# Patient Record
Sex: Female | Born: 1990 | Race: Black or African American | Hispanic: No | Marital: Single | Smoking: Current every day smoker
Health system: Southern US, Community
[De-identification: ages and names within clinical notes are randomized; demographics above are authoritative.]

## PROBLEM LIST (undated history)

## (undated) ENCOUNTER — Ambulatory Visit (HOSPITAL_COMMUNITY): Admission: EM | Disposition: A | Discharge status: Home / Self Care | Payer: Medicaid Other

## (undated) ENCOUNTER — Emergency Department (HOSPITAL_COMMUNITY): Admission: EM | Payer: Medicaid Other | Source: Home / Self Care

## (undated) DIAGNOSIS — K4090 Unilateral inguinal hernia, without obstruction or gangrene, not specified as recurrent: Secondary | ICD-10-CM

## (undated) DIAGNOSIS — I10 Essential (primary) hypertension: Secondary | ICD-10-CM

## (undated) HISTORY — PX: HERNIA REPAIR: SHX51

---

## 2015-01-02 ENCOUNTER — Emergency Department (HOSPITAL_BASED_OUTPATIENT_CLINIC_OR_DEPARTMENT_OTHER): Payer: Self-pay

## 2015-01-02 ENCOUNTER — Encounter (HOSPITAL_BASED_OUTPATIENT_CLINIC_OR_DEPARTMENT_OTHER): Payer: Self-pay | Admitting: Emergency Medicine

## 2015-01-02 ENCOUNTER — Emergency Department (HOSPITAL_BASED_OUTPATIENT_CLINIC_OR_DEPARTMENT_OTHER)
Admission: EM | Admit: 2015-01-02 | Discharge: 2015-01-02 | Disposition: A | Discharge status: Home / Self Care | Payer: Self-pay | Attending: Emergency Medicine | Admitting: Emergency Medicine

## 2015-01-02 DIAGNOSIS — Z720 Tobacco use: Secondary | ICD-10-CM | POA: Insufficient documentation

## 2015-01-02 DIAGNOSIS — R319 Hematuria, unspecified: Secondary | ICD-10-CM

## 2015-01-02 DIAGNOSIS — Z8719 Personal history of other diseases of the digestive system: Secondary | ICD-10-CM | POA: Insufficient documentation

## 2015-01-02 DIAGNOSIS — R1084 Generalized abdominal pain: Secondary | ICD-10-CM

## 2015-01-02 DIAGNOSIS — N390 Urinary tract infection, site not specified: Secondary | ICD-10-CM | POA: Insufficient documentation

## 2015-01-02 DIAGNOSIS — R102 Pelvic and perineal pain: Secondary | ICD-10-CM

## 2015-01-02 DIAGNOSIS — Z3202 Encounter for pregnancy test, result negative: Secondary | ICD-10-CM | POA: Insufficient documentation

## 2015-01-02 DIAGNOSIS — N898 Other specified noninflammatory disorders of vagina: Secondary | ICD-10-CM | POA: Insufficient documentation

## 2015-01-02 HISTORY — DX: Unilateral inguinal hernia, without obstruction or gangrene, not specified as recurrent: K40.90

## 2015-01-02 LAB — URINALYSIS, ROUTINE W REFLEX MICROSCOPIC
BILIRUBIN URINE: NEGATIVE
Glucose, UA: NEGATIVE mg/dL
Ketones, ur: 40 mg/dL — AB
Nitrite: NEGATIVE
PH: 6 (ref 5.0–8.0)
Protein, ur: NEGATIVE mg/dL
SPECIFIC GRAVITY, URINE: 1.017 (ref 1.005–1.030)
UROBILINOGEN UA: 0.2 mg/dL (ref 0.0–1.0)

## 2015-01-02 LAB — CBC WITH DIFFERENTIAL/PLATELET
BASOS ABS: 0 10*3/uL (ref 0.0–0.1)
BASOS PCT: 0 % (ref 0–1)
EOS PCT: 0 % (ref 0–5)
Eosinophils Absolute: 0 10*3/uL (ref 0.0–0.7)
HEMATOCRIT: 35.3 % — AB (ref 36.0–46.0)
Hemoglobin: 11.7 g/dL — ABNORMAL LOW (ref 12.0–15.0)
LYMPHS PCT: 4 % — AB (ref 12–46)
Lymphs Abs: 0.9 10*3/uL (ref 0.7–4.0)
MCH: 27.5 pg (ref 26.0–34.0)
MCHC: 33.1 g/dL (ref 30.0–36.0)
MCV: 83.1 fL (ref 78.0–100.0)
MONO ABS: 0.8 10*3/uL (ref 0.1–1.0)
Monocytes Relative: 4 % (ref 3–12)
NEUTROS ABS: 20.1 10*3/uL — AB (ref 1.7–7.7)
Neutrophils Relative %: 92 % — ABNORMAL HIGH (ref 43–77)
PLATELETS: 174 10*3/uL (ref 150–400)
RBC: 4.25 MIL/uL (ref 3.87–5.11)
RDW: 13 % (ref 11.5–15.5)
WBC: 21.9 10*3/uL — AB (ref 4.0–10.5)

## 2015-01-02 LAB — COMPREHENSIVE METABOLIC PANEL
ALBUMIN: 3.7 g/dL (ref 3.5–5.0)
ALT: 8 U/L — AB (ref 14–54)
AST: 13 U/L — AB (ref 15–41)
Alkaline Phosphatase: 51 U/L (ref 38–126)
Anion gap: 7 (ref 5–15)
BUN: 7 mg/dL (ref 6–20)
CHLORIDE: 102 mmol/L (ref 101–111)
CO2: 28 mmol/L (ref 22–32)
CREATININE: 0.87 mg/dL (ref 0.44–1.00)
Calcium: 8.4 mg/dL — ABNORMAL LOW (ref 8.9–10.3)
GFR calc Af Amer: >60 mL/min (ref >60)
GFR calc non Af Amer: >60 mL/min (ref >60)
GLUCOSE: 103 mg/dL — AB (ref 65–99)
POTASSIUM: 3 mmol/L — AB (ref 3.5–5.1)
Sodium: 137 mmol/L (ref 135–145)
Total Bilirubin: 1 mg/dL (ref 0.3–1.2)
Total Protein: 7.1 g/dL (ref 6.5–8.1)

## 2015-01-02 LAB — WET PREP, GENITAL
TRICH WET PREP: NONE SEEN
YEAST WET PREP: NONE SEEN

## 2015-01-02 LAB — URINE MICROSCOPIC-ADD ON

## 2015-01-02 LAB — LIPASE, BLOOD: Lipase: 14 U/L — ABNORMAL LOW (ref 22–51)

## 2015-01-02 LAB — PREGNANCY, URINE: Preg Test, Ur: NEGATIVE

## 2015-01-02 MED ORDER — IOHEXOL 300 MG/ML  SOLN
100.0000 mL | Freq: Once | INTRAMUSCULAR | Status: AC | PRN
  Administered 2015-01-02: 100 mL via INTRAVENOUS

## 2015-01-02 MED ORDER — ONDANSETRON HCL 4 MG PO TABS: 4.0000 mg | ORAL_TABLET | Freq: Three times a day (TID) | ORAL | Status: DC | PRN

## 2015-01-02 MED ORDER — SODIUM CHLORIDE 0.9 % IV BOLUS (SEPSIS)
1000.0000 mL | Freq: Once | INTRAVENOUS | Status: AC
  Administered 2015-01-02: 1000 mL via INTRAVENOUS

## 2015-01-02 MED ORDER — CEFTRIAXONE SODIUM 250 MG IJ SOLR
INTRAMUSCULAR | Status: AC
  Filled 2015-01-02: qty 250

## 2015-01-02 MED ORDER — ONDANSETRON HCL 4 MG/2ML IJ SOLN
4.0000 mg | Freq: Once | INTRAMUSCULAR | Status: AC
  Administered 2015-01-02: 4 mg via INTRAVENOUS
  Filled 2015-01-02: qty 2

## 2015-01-02 MED ORDER — HYDROCODONE-ACETAMINOPHEN 5-325 MG PO TABS: 1.0000 | ORAL_TABLET | Freq: Four times a day (QID) | ORAL | Status: DC | PRN

## 2015-01-02 MED ORDER — IOHEXOL 300 MG/ML  SOLN
25.0000 mL | Freq: Once | INTRAMUSCULAR | Status: AC | PRN
  Administered 2015-01-02: 25 mL via ORAL

## 2015-01-02 MED ORDER — CEPHALEXIN 500 MG PO CAPS: 500.0000 mg | ORAL_CAPSULE | Freq: Two times a day (BID) | ORAL | Status: DC

## 2015-01-02 MED ORDER — MORPHINE SULFATE (PF) 4 MG/ML IV SOLN
4.0000 mg | Freq: Once | INTRAVENOUS | Status: AC
  Administered 2015-01-02: 4 mg via INTRAVENOUS
  Filled 2015-01-02: qty 1

## 2015-01-02 MED ORDER — DEXTROSE 5 % IV SOLN
250.0000 mg | Freq: Once | INTRAVENOUS | Status: AC
  Administered 2015-01-02: 250 mg via INTRAVENOUS
  Filled 2015-01-02: qty 250

## 2015-01-02 MED ORDER — GI COCKTAIL ~~LOC~~
30.0000 mL | Freq: Once | ORAL | Status: AC
  Administered 2015-01-02: 30 mL via ORAL
  Filled 2015-01-02: qty 30

## 2015-01-02 MED ORDER — AZITHROMYCIN 250 MG PO TABS
1000.0000 mg | ORAL_TABLET | Freq: Once | ORAL | Status: AC
  Administered 2015-01-02: 1000 mg via ORAL
  Filled 2015-01-02: qty 4

## 2015-01-02 NOTE — Discharge Instructions (Signed)

## 2015-01-02 NOTE — ED Notes (Signed)
Patient went to fair Sunday night and rode the fair rides. The patient reports that when she got home she started to have pain in her stomach. The patient reports that early this am the pain is so bad that she is unable to walk around due to the pain in her stomach

## 2015-01-02 NOTE — ED Provider Notes (Signed)
CSN: [id number]     Arrival date & time 01/02/15  4098 History   First MD Initiated Contact with Patient 01/02/15 [phone]     Chief Complaint  Patient presents with  . Abdominal Pain     (Consider location/radiation/quality/duration/timing/severity/associated sxs/prior Treatment) Patient is a 24 y.o. female presenting with abdominal pain.  Abdominal Pain Pain location:  Epigastric Pain quality comment:  Tearing Pain radiation: around to both flanks. Pain severity:  Severe Onset quality:  Gradual Duration:  2 days Timing:  Constant Progression:  Worsening Chronicity:  New Context comment:  Began after a trip to the fair.  she started feeling bad after going on rides.  she ate funnel cake, a juice smoothie, and roasted corn. Relieved by:  Nothing Exacerbated by: standing, having bowel movements or urinating. Ineffective treatments:  NSAIDs Associated symptoms: dysuria, nausea and vomiting (x 1. yesterday)   Associated symptoms: no constipation, no diarrhea and no hematuria     Past Medical History  Diagnosis Date  . Hernia, inguinal    Past Surgical History  Procedure Laterality Date  . Hernia repair     History reviewed. No pertinent family history. Social History  Substance Use Topics  . Smoking status: Current Some Day Smoker  . Smokeless tobacco: None  . Alcohol Use: Yes     Comment: occ   OB History    No data available     Review of Systems  Gastrointestinal: Positive for nausea, vomiting (x 1. yesterday) and abdominal pain. Negative for diarrhea and constipation.  Genitourinary: Positive for dysuria. Negative for hematuria.  All other systems reviewed and are negative.     Allergies  Sulfa antibiotics  Home Medications   Prior to Admission medications   Medication Sig Start Date End Date Taking? Authorizing Provider  ibuprofen (ADVIL,MOTRIN) 200 MG tablet Take 200 mg by mouth every 6 (six) hours as needed.   Yes Historical Provider, MD   BP 130/73  mmHg  Pulse 104  Temp(Src) 99.9 F (37.7 C) (Oral)  Resp 20  Ht 5\' 9"  (1.753 m)  Wt 170 lb (77.111 kg)  BMI 25.09 kg/m2  SpO2 98%  LMP 01/02/2015 Physical Exam  Constitutional: She is oriented to person, place, and time. She appears well-developed and well-nourished. No distress.  HENT:  Head: Normocephalic and atraumatic.  Mouth/Throat: Oropharynx is clear and moist.  Eyes: Conjunctivae are normal. Pupils are equal, round, and reactive to light. No scleral icterus.  Neck: Neck supple.  Cardiovascular: Normal rate, regular rhythm, normal heart sounds and intact distal pulses.   No murmur heard. Pulmonary/Chest: Effort normal and breath sounds normal. No stridor. No respiratory distress. She has no rales.  Abdominal: Soft. Bowel sounds are normal. She exhibits no distension. There is no CVA tenderness.  Diffuse abdominal pain, worst in epigastrium.  No rigidity, rebound, or guarding.  Genitourinary: There is no tenderness or lesion on the right labia. There is no tenderness or lesion on the left labia. Uterus is tender. Cervix exhibits motion tenderness, discharge and friability. Right adnexum displays tenderness. Right adnexum displays no mass. Left adnexum displays tenderness. Left adnexum displays no mass. No signs of injury around the vagina. Vaginal discharge (yellow, copious) found.  Musculoskeletal: Normal range of motion.  Neurological: She is alert and oriented to person, place, and time.  Skin: Skin is warm and dry. No rash noted.  Psychiatric: She has a normal mood and affect. Her behavior is normal.  Nursing note and vitals reviewed.   ED Course  Procedures (including critical care time) Labs Review Labs Reviewed  WET PREP, GENITAL - Abnormal; Notable for the following:    Clue Cells Wet Prep HPF POC TOO NUMEROUS TO COUNT (*)    WBC, Wet Prep HPF POC MANY (*)    All other components within normal limits  URINALYSIS, ROUTINE W REFLEX MICROSCOPIC (NOT AT [facility]) -  Abnormal; Notable for the following:    Color, Urine AMBER (*)    APPearance CLOUDY (*)    Hgb urine dipstick MODERATE (*)    Ketones, ur 40 (*)    Leukocytes, UA MODERATE (*)    All other components within normal limits  URINE MICROSCOPIC-ADD ON - Abnormal; Notable for the following:    Squamous Epithelial / LPF FEW (*)    Bacteria, UA MANY (*)    All other components within normal limits  CBC WITH DIFFERENTIAL/PLATELET - Abnormal; Notable for the following:    WBC 21.9 (*)    Hemoglobin 11.7 (*)    HCT 35.3 (*)    Neutrophils Relative % 92 (*)    Neutro Abs 20.1 (*)    Lymphocytes Relative 4 (*)    All other components within normal limits  COMPREHENSIVE METABOLIC PANEL - Abnormal; Notable for the following:    Potassium 3.0 (*)    Glucose, Bld 103 (*)    Calcium 8.4 (*)    AST 13 (*)    ALT 8 (*)    All other components within normal limits  LIPASE, BLOOD - Abnormal; Notable for the following:    Lipase 14 (*)    All other components within normal limits  PREGNANCY, URINE  HIV ANTIBODY (ROUTINE TESTING)  RPR  GC/CHLAMYDIA PROBE AMP ([place]) NOT AT [facility]    Imaging Review US Transvaginal Non-ob  01/02/2015   CLINICAL DATA:  Pelvic pain for 3 days.  EXAM: TRANSABDOMINAL AND TRANSVAGINAL ULTRASOUND OF PELVIS  DOPPLER ULTRASOUND OF OVARIES  TECHNIQUE: Both transabdominal and transvaginal ultrasound examinations of the pelvis were performed. Transabdominal technique was performed for global imaging of the pelvis including uterus, ovaries, adnexal regions, and pelvic cul-de-sac.  It was necessary to proceed with endovaginal exam following the transabdominal exam to visualize the ovaries. Color and duplex Doppler ultrasound was utilized to evaluate blood flow to the ovaries.  COMPARISON:  None.  FINDINGS: Uterus  Measurements: 9.6 x 3.3 x 4.5 cm. There are 4 slight hypoechoic lesions in the uterus, largest measures 1.5 x 1.7 x 1.3 cm in the anterior myometrium. These are probably  fibroadenomas. 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  Endometrium  Thickness: 4 mm. No focal abnormality visualized. There is fluid in the endometrial canal. There is a question 3 mm isoechoic focus in the endometrial canal, questioned polyp.  Right ovary  Measurements: 4.4 x 2.1 x 2.5 cm. Normal appearance/no adnexal mass.  Left ovary  Measurements: 4.4 x 3.1 x 3.3 cm. Normal appearance/no adnexal mass.  Pulsed Doppler evaluation of both ovaries demonstrates normal low-resistance arterial and venous waveforms.  Other findings  No free fluid. There is a 1.8 x 1.5 x 1.4 cm cyst adjacent to the left ovary.  IMPRESSION: Uterine fibroids.  Fluid in endometrial canal. Question polyp in the endometrial canal.   Electronically Signed   By: Sherian Rein M.D.   On: 01/02/2015 13:05   US Pelvis Complete  01/02/2015   CLINICAL DATA:  Pelvic pain for 3 days.  EXAM: TRANSABDOMINAL AND TRANSVAGINAL ULTRASOUND OF PELVIS  DOPPLER ULTRASOUND OF OVARIES  TECHNIQUE:  Both transabdominal and transvaginal ultrasound examinations of the pelvis were performed. Transabdominal technique was performed for global imaging of the pelvis including uterus, ovaries, adnexal regions, and pelvic cul-de-sac.  It was necessary to proceed with endovaginal exam following the transabdominal exam to visualize the ovaries. Color and duplex Doppler ultrasound was utilized to evaluate blood flow to the ovaries.  COMPARISON:  None.  FINDINGS: Uterus  Measurements: 9.6 x 3.3 x 4.5 cm. There are 4 slight hypoechoic lesions in the uterus, largest measures 1.5 x 1.7 x 1.3 cm in the anterior myometrium. 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  Endometrium  Thickness: 4 mm. No focal abnormality visualized. There is fluid in the endometrial canal. There is a question 3 mm isoechoic focus in the endometrial canal, questioned polyp.  Right ovary  Measurements: 4.4 x 2.1 x 2.5 cm. Normal appearance/no adnexal mass.  Left ovary   Measurements: 4.4 x 3.1 x 3.3 cm. Normal appearance/no adnexal mass.  Pulsed Doppler evaluation of both ovaries demonstrates normal low-resistance arterial and venous waveforms.  Other findings  No free fluid. There is a 1.8 x 1.5 x 1.4 cm cyst adjacent to the left ovary.  IMPRESSION: Uterine fibroids.  Fluid in endometrial canal. Question polyp in the endometrial canal.   Electronically Signed   By: Sherian Rein M.D.   On: 01/02/2015 13:05   Ct Abdomen Pelvis W Contrast  01/02/2015   CLINICAL DATA:  Mid abdominal pain since Sunday after riding on fair rides  EXAM: CT ABDOMEN AND PELVIS WITH CONTRAST  TECHNIQUE: Multidetector CT imaging of the abdomen and pelvis was performed using the standard protocol following bolus administration of intravenous contrast. Sagittal and coronal MPR images reconstructed from axial data set.  CONTRAST:  OMNIPAQUE IOHEXOL 300 MG/ML SOLN IV, 25mL OMNIPAQUE IOHEXOL 300 MG/ML SOLN PO  COMPARISON:  None  FINDINGS: Lung bases clear.  Indeterminate 5 mm intermediate attenuation lesion superiorly within liver image 10.  Liver, gallbladder, spleen, pancreas, kidneys, and adrenal glands otherwise normal.  Suboptimal opacification the GI tract, with small amounts of contrast identified only within the stomach and proximal small bowel.  No gross gastric or bowel abnormality identified within these limitations.  Appendix not localized.  Question 14 mm leiomyoma within uterus.  Otherwise normal appearing uterus, adnexae, bladder, and ureters.  Scattered pelvic phleboliths.  No mass, adenopathy, free fluid, free air or hernia.  Osseous structures unremarkable.  IMPRESSION: Questionable tiny leiomyoma at uterine fundus.  5 mm indeterminate liver lesion.  Otherwise negative exam.   Electronically Signed   By: Ulyses Southward M.D.   On: 01/02/2015 09:16   [place] Art/ven Flow Abd Pelv Doppler  01/02/2015   CLINICAL DATA:  Pelvic pain for 3 days.  EXAM: TRANSABDOMINAL AND TRANSVAGINAL ULTRASOUND OF  PELVIS  DOPPLER ULTRASOUND OF OVARIES  TECHNIQUE: Both transabdominal and transvaginal ultrasound examinations of the pelvis were performed. Transabdominal technique was performed for global imaging of the pelvis including uterus, ovaries, adnexal regions, and pelvic cul-de-sac.  It was necessary to proceed with endovaginal exam following the transabdominal exam to visualize the ovaries. Color and duplex Doppler ultrasound was utilized to evaluate blood flow to the ovaries.  COMPARISON:  None.  FINDINGS: Uterus  Measurements: 9.6 x 3.3 x 4.5 cm. There are 4 slight hypoechoic lesions in the uterus, largest measures 1.5 x 1.7 x 1.3 cm in the anterior myometrium. 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  Endometrium  Thickness: 4 mm. No focal abnormality  visualized. There is fluid in the endometrial canal. There is a question 3 mm isoechoic focus in the endometrial canal, questioned polyp.  Right ovary  Measurements: 4.4 x 2.1 x 2.5 cm. Normal appearance/no adnexal mass.  Left ovary  Measurements: 4.4 x 3.1 x 3.3 cm. Normal appearance/no adnexal mass.  Pulsed Doppler evaluation of both ovaries demonstrates normal low-resistance arterial and venous waveforms.  Other findings  No free fluid. There is a 1.8 x 1.5 x 1.4 cm cyst adjacent to the left ovary.  IMPRESSION: Uterine fibroids.  Fluid in endometrial canal. Question polyp in the endometrial canal.   Electronically Signed   By: Sherian Rein M.D.   On: 01/02/2015 13:05   I have personally reviewed and evaluated these images and lab results as part of my medical decision-making.   EKG Interpretation None      MDM   Final diagnoses:  Pelvic pain in female  Generalized abdominal pain  Urinary tract infection with hematuria, site unspecified    24 yo female with generalized abdominal pain.  Initially, pain appeared more epigastric.  However, after pain medication, pain localized more to LLQ.  CT scan did not reveal etiology.   Pelvic exam then revealed copious discharge and cervical friability.  She endorsed multiple sexual partners and inconsistent condom use.  Treated empirically with ceftriaxone and azithromycin.  [place] did not show evidence of TOA.  DC'd home, but given strict return precautions.  Has mild evidence of UTI, but with pain and leukocytosis, I think this should be treated with abx.      Blake Divine, MD 01/02/15 [phone]

## 2015-01-03 LAB — HIV ANTIBODY (ROUTINE TESTING W REFLEX): HIV SCREEN 4TH GENERATION: NONREACTIVE

## 2015-01-03 LAB — GC/CHLAMYDIA PROBE AMP (~~LOC~~) NOT AT ARMC
Chlamydia: NEGATIVE
Neisseria Gonorrhea: POSITIVE — AB

## 2015-01-03 LAB — RPR: RPR Ser Ql: NONREACTIVE

## 2015-01-04 ENCOUNTER — Telehealth (HOSPITAL_BASED_OUTPATIENT_CLINIC_OR_DEPARTMENT_OTHER): Payer: Self-pay | Admitting: Emergency Medicine

## 2015-01-04 LAB — URINE CULTURE

## 2015-11-22 ENCOUNTER — Encounter: Payer: Self-pay | Admitting: *Deleted

## 2015-12-05 ENCOUNTER — Encounter: Payer: Self-pay | Admitting: Medical

## 2016-04-21 NOTE — L&D Delivery Note (Cosign Needed)
Patient is a 26 y.o. now G3P10021s/p NSVD at 7270w6d, who was admitted for IOL for gHTN.  Delivery Note At 3:09 AM a viable female was delivered via Vaginal, Spontaneous Delivery (Presentation: Cephalic ; LOA).  APGAR: 8, ; weight pending   Placenta status:intact Cord: 3 vessel  with the following complications: none .   Anesthesia: epidural  Episiotomy:  none Lacerations: vaginal laceration  Suture Repair: 2.0 vicryl Est. Blood Loss (mL):  200  Mom to postpartum.  Baby to Couplet care / Skin to Skin.  Head delivered LOA. No nuchal cord present. Shoulder and body delivered in usual fashion. Infant with spontaneous cry, placed on mother's abdomen, dried and bulb suctioned. Cord clamped x 2 after 1-minute delay, and cut by family member. Cord blood drawn. Placenta delivered spontaneously with gentle cord traction. Fundus firm with massage and Pitocin. Perineum inspected and found to have vaginal laceration which was repaired with 2.0 vicryl with good hemostasis achieved.  Teodoro KilKerrriann S. Minott,  MD Family Medicine Resident PGY-1 02/13/17, 3:32 AM  The above was performed by Dr. Verta EllenMinott under my direct supervision and guidance.    Please schedule this patient for PP visit in: 1 week High risk pregnancy complicated by: HTN Delivery mode:  SVD Anticipated Birth Control:  POPs PP Procedures needed: BP check  Schedule Integrated BH visit: no Provider: Any provider

## 2016-07-20 ENCOUNTER — Inpatient Hospital Stay (HOSPITAL_COMMUNITY)
Admission: AD | Admit: 2016-07-20 | Discharge: 2016-07-20 | Disposition: A | Discharge status: Home / Self Care | Payer: Medicaid Other | Source: Ambulatory Visit | Attending: Obstetrics and Gynecology | Admitting: Obstetrics and Gynecology

## 2016-07-20 ENCOUNTER — Encounter (HOSPITAL_COMMUNITY): Payer: Self-pay | Admitting: *Deleted

## 2016-07-20 DIAGNOSIS — N912 Amenorrhea, unspecified: Secondary | ICD-10-CM | POA: Diagnosis not present

## 2016-07-20 DIAGNOSIS — N926 Irregular menstruation, unspecified: Secondary | ICD-10-CM

## 2016-07-20 NOTE — MAU Provider Note (Signed)
  S:   26 y.o. No obstetric history on file.  by LMP presents to MAU for pregnancy confirmation.  She denies abdominal pain or vaginal bleeding today. States she wants to "see how far along" she is.   O: BP 128/79 (BP Location: Right Arm)   Pulse 66   Temp 98.3 F (36.8 C) (Oral)   Resp 16   Wt 154 lb 4 oz (70 kg)   LMP 05/24/2016   SpO2 100%   BMI 22.78 kg/m  Physical Examination: General appearance - alert, well appearing, and in no distress, oriented to person, place, and time and acyanotic, in no respiratory distress  No results found for this or any previous visit (from the past 48 hour(s)).  A: Missed menses  P: D/C home Informed pt we do not do pregnancy verifications in MAU Referred to [facility] for pregnancy test & verification Return to MAU as needed for pregnancy related emergencies  Judeth Horn, NP 2:28 PM

## 2016-07-20 NOTE — MAU Note (Signed)
+  HPT on Friday.  Here to check on her and the baby and makes sure everything is ok. No problems, just discomfort when she is sleeping..  Denies any pain or bleeding.

## 2016-07-24 ENCOUNTER — Encounter: Payer: Self-pay | Admitting: Family Medicine

## 2016-07-24 ENCOUNTER — Ambulatory Visit (INDEPENDENT_AMBULATORY_CARE_PROVIDER_SITE_OTHER): Payer: Medicaid Other | Admitting: *Deleted

## 2016-07-24 DIAGNOSIS — Z3201 Encounter for pregnancy test, result positive: Secondary | ICD-10-CM | POA: Diagnosis not present

## 2016-07-24 DIAGNOSIS — Z3200 Encounter for pregnancy test, result unknown: Secondary | ICD-10-CM

## 2016-07-24 LAB — POCT PREGNANCY, URINE: Preg Test, Ur: POSITIVE — AB

## 2016-07-24 NOTE — Progress Notes (Signed)
Pt informed of positive pregnancy test today. Medication reconciliation completed. Unsure LMP - 05/22/16. EDD 02/26/17.  Pt will schedule prenatal care.

## 2016-08-13 ENCOUNTER — Encounter: Payer: Self-pay | Admitting: Obstetrics & Gynecology

## 2016-08-13 ENCOUNTER — Ambulatory Visit (INDEPENDENT_AMBULATORY_CARE_PROVIDER_SITE_OTHER): Payer: Medicaid Other | Admitting: Obstetrics & Gynecology

## 2016-08-13 ENCOUNTER — Other Ambulatory Visit (HOSPITAL_COMMUNITY)
Admission: RE | Admit: 2016-08-13 | Discharge: 2016-08-13 | Disposition: A | Discharge status: Home / Self Care | Payer: Medicaid Other | Source: Ambulatory Visit | Attending: Obstetrics & Gynecology | Admitting: Obstetrics & Gynecology

## 2016-08-13 VITALS — BP 120/75 | HR 79 | Wt 167.0 lb

## 2016-08-13 DIAGNOSIS — Z3481 Encounter for supervision of other normal pregnancy, first trimester: Secondary | ICD-10-CM | POA: Diagnosis not present

## 2016-08-13 DIAGNOSIS — O99330 Smoking (tobacco) complicating pregnancy, unspecified trimester: Secondary | ICD-10-CM | POA: Insufficient documentation

## 2016-08-13 DIAGNOSIS — Z3689 Encounter for other specified antenatal screening: Secondary | ICD-10-CM | POA: Diagnosis not present

## 2016-08-13 DIAGNOSIS — Z3491 Encounter for supervision of normal pregnancy, unspecified, first trimester: Secondary | ICD-10-CM

## 2016-08-13 DIAGNOSIS — O99331 Smoking (tobacco) complicating pregnancy, first trimester: Secondary | ICD-10-CM

## 2016-08-13 DIAGNOSIS — Z3400 Encounter for supervision of normal first pregnancy, unspecified trimester: Secondary | ICD-10-CM | POA: Insufficient documentation

## 2016-08-13 NOTE — Progress Notes (Signed)
  Subjective:    Kristine Oconnell is being seen today for her first obstetrical visit.  [id number]. Pt is s/p EAB in first trimester and SAB in 1st trimester. This is a planned pregnancy. She is at [redacted]w[redacted]d gestation. Her obstetrical history is significant for smoker. Pt smokes once a week cigarettes.  Relationship with FOB: significant other, not living together. Patient does intend to breast feed. Pregnancy history fully reviewed.  Patient reports bleeding.  This was noted after intercourse.  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   Objective:     LMP 05/24/2016 (Within Days)  Physical Exam  Exam General Appearance:    Alert, cooperative, no distress, appears stated age  Head:    Normocephalic, without obvious abnormality, atraumatic  Eyes:    conjunctiva/corneas clear, EOM's intact, both eyes  Ears:    Normal external ear canals, both ears  Nose:   Nares normal, septum midline, mucosa normal, no drainage    or sinus tenderness  Throat:   Lips, mucosa, and tongue normal; teeth and gums normal  Neck:   Supple, symmetrical, trachea midline, no adenopathy;    thyroid:  no enlargement/tenderness/nodules  Back:     Symmetric, no curvature, ROM normal, no CVA tenderness  Lungs:     Clear to auscultation bilaterally, respirations unlabored  Chest Wall:    No tenderness or deformity   Heart:    Regular rate and rhythm, S1 and S2 normal, no murmur, rub   or gallop  Breast Exam:    No tenderness, masses, or nipple abnormality  Abdomen:     Soft, non-tender, bowel sounds active all four quadrants,    no masses, no organomegaly  Genitalia:    Normal female without lesion, discharge or tenderness; enlarged uterus: 12 weeks sized     Extremities:   Extremities normal, atraumatic, no cyanosis or edema  Pulses:   2+ and symmetric all extremities  Skin:   Skin color, texture, turgor normal, no rashes or lesions     Assessment:    Pregnancy: G3P0020 first  trimester First trimester bleeding noted- rec no intercourse for 4 week. VIable IUP noted on [place] today tob use- rec tobacco cessation      Plan:     Initial labs drawn. Prenatal vitamins. Problem list reviewed and updated. AFP3 discussed: requested. Role of ultrasound in pregnancy discussed; fetal survey: requested. Amniocentesis discussed: not indicated. Follow up in 4 weeks. 60% of 40 min visit spent on counseling and coordination of care.     Willodean Rosenthal 08/13/2016

## 2016-08-13 NOTE — Patient Instructions (Addendum)
First Trimester of Pregnancy The first trimester of pregnancy is from week 1 until the end of week 13 (months 1 through 3). A week after a sperm fertilizes an egg, the egg will implant on the wall of the uterus. This embryo will begin to develop into a baby. Genes from you and your partner will form the baby. The female genes will determine whether the baby will be a boy or a girl. At 6-8 weeks, the eyes and face will be formed, and the heartbeat can be seen on ultrasound. At the end of 12 weeks, all the baby's organs will be formed. Now that you are pregnant, you will want to do everything you can to have a healthy baby. Two of the most important things are to get good prenatal care and to follow your health care provider's instructions. Prenatal care is all the medical care you receive before the baby's birth. This care will help prevent, find, and treat any problems during the pregnancy and childbirth. Body changes during your first trimester Your body goes through many changes during pregnancy. The changes vary from woman to woman. You may gain or lose a couple of pounds at first. You may feel sick to your stomach (nauseous) and you may throw up (vomit). If the vomiting is uncontrollable, call your health care provider. You may tire easily. You may develop headaches that can be relieved by medicines. All medicines should be approved by your health care provider. You may urinate more often. Painful urination may mean you have a bladder infection. You may develop heartburn as a result of your pregnancy. You may develop constipation because certain hormones are causing the muscles that push stool through your intestines to slow down. You may develop hemorrhoids or swollen veins (varicose veins). Your breasts may begin to grow larger and become tender. Your nipples may stick out more, and the tissue that surrounds them (areola) may become darker. Your gums may bleed and may be sensitive to brushing and  flossing. Dark spots or blotches (chloasma, mask of pregnancy) may develop on your face. This will likely fade after the baby is born. Your menstrual periods will stop. You may have a loss of appetite. You may develop cravings for certain kinds of food. You may have changes in your emotions from day to day, such as being excited to be pregnant or being concerned that something may go wrong with the pregnancy and baby. You may have more vivid and strange dreams. You may have changes in your hair. These can include thickening of your hair, rapid growth, and changes in texture. Some women also have hair loss during or after pregnancy, or hair that feels dry or thin. Your hair will most likely return to normal after your baby is born. What to expect at prenatal visits During a routine prenatal visit: You will be weighed to make sure you and the baby are growing normally. Your blood pressure will be taken. Your abdomen will be measured to track your baby's growth. The fetal heartbeat will be listened to between weeks 10 and 14 of your pregnancy. Test results from any previous visits will be discussed. Your health care provider may ask you: How you are feeling. If you are feeling the baby move. If you have had any abnormal symptoms, such as leaking fluid, bleeding, severe headaches, or abdominal cramping. If you are using any tobacco products, including cigarettes, chewing tobacco, and electronic cigarettes. If you have any questions. Other tests that may be  performed during your first trimester include: Blood tests to find your blood type and to check for the presence of any previous infections. The tests will also be used to check for low iron levels (anemia) and protein on red blood cells (Rh antibodies). Depending on your risk factors, or if you previously had diabetes during pregnancy, you may have tests to check for high blood sugar that affects pregnant women (gestational diabetes). Urine  tests to check for infections, diabetes, or protein in the urine. An ultrasound to confirm the proper growth and development of the baby. Fetal screens for spinal cord problems (spina bifida) and Down syndrome. HIV (human immunodeficiency virus) testing. Routine prenatal testing includes screening for HIV, unless you choose not to have this test. You may need other tests to make sure you and the baby are doing well. Follow these instructions at home: Medicines  Follow your health care provider's instructions regarding medicine use. Specific medicines may be either safe or unsafe to take during pregnancy. Take a prenatal vitamin that contains at least 600 micrograms (mcg) of folic acid. If you develop constipation, try taking a stool softener if your health care provider approves. Eating and drinking  Eat a balanced diet that includes fresh fruits and vegetables, whole grains, good sources of protein such as meat, eggs, or tofu, and low-fat dairy. Your health care provider will help you determine the amount of weight gain that is right for you. Avoid raw meat and uncooked cheese. These carry germs that can cause birth defects in the baby. Eating four or five small meals rather than three large meals a day may help relieve nausea and vomiting. If you start to feel nauseous, eating a few soda crackers can be helpful. Drinking liquids between meals, instead of during meals, also seems to help ease nausea and vomiting. Limit foods that are high in fat and processed sugars, such as fried and sweet foods. To prevent constipation: Eat foods that are high in fiber, such as fresh fruits and vegetables, whole grains, and beans. Drink enough fluid to keep your urine clear or pale yellow. Activity  Exercise only as directed by your health care provider. Most women can continue their usual exercise routine during pregnancy. Try to exercise for 30 minutes at least 5 days a week. Exercising will help  you: Control your weight. Stay in shape. Be prepared for labor and delivery. Experiencing pain or cramping in the lower abdomen or lower back is a good sign that you should stop exercising. Check with your health care provider before continuing with normal exercises. Try to avoid standing for long periods of time. Move your legs often if you must stand in one place for a long time. Avoid heavy lifting. Wear low-heeled shoes and practice good posture. You may continue to have sex unless your health care provider tells you not to. Relieving pain and discomfort  Wear a good support bra to relieve breast tenderness. Take warm sitz baths to soothe any pain or discomfort caused by hemorrhoids. Use hemorrhoid cream if your health care provider approves. Rest with your legs elevated if you have leg cramps or low back pain. If you develop varicose veins in your legs, wear support hose. Elevate your feet for 15 minutes, 3-4 times a day. Limit salt in your diet. Prenatal care  Schedule your prenatal visits by the twelfth week of pregnancy. They are usually scheduled monthly at first, then more often in the last 2 months before delivery. Write down your  questions. Take them to your prenatal visits. Keep all your prenatal visits as told by your health care provider. This is important. Safety  Wear your seat belt at all times when driving. Make a list of emergency phone numbers, including numbers for family, friends, the hospital, and police and fire departments. General instructions  Ask your health care provider for a referral to a local prenatal education class. Begin classes no later than the beginning of month 6 of your pregnancy. Ask for help if you have counseling or nutritional needs during pregnancy. Your health care provider can offer advice or refer you to specialists for help with various needs. Do not use hot tubs, steam rooms, or saunas. Do not douche or use tampons or scented sanitary  pads. Do not cross your legs for long periods of time. Avoid cat litter boxes and soil used by cats. These carry germs that can cause birth defects in the baby and possibly loss of the fetus by miscarriage or stillbirth. Avoid all smoking, herbs, alcohol, and medicines not prescribed by your health care provider. Chemicals in these products affect the formation and growth of the baby. Do not use any products that contain nicotine or tobacco, such as cigarettes and e-cigarettes. If you need help quitting, ask your health care provider. You may receive counseling support and other resources to help you quit. Schedule a dentist appointment. At home, brush your teeth with a soft toothbrush and be gentle when you floss. Contact a health care provider if: You have dizziness. You have mild pelvic cramps, pelvic pressure, or nagging pain in the abdominal area. You have persistent nausea, vomiting, or diarrhea. You have a bad smelling vaginal discharge. You have pain when you urinate. You notice increased swelling in your face, hands, legs, or ankles. You are exposed to fifth disease or chickenpox. You are exposed to [place] measles (rubella) and have never had it. Get help right away if: You have a fever. You are leaking fluid from your vagina. You have spotting or bleeding from your vagina. You have severe abdominal cramping or pain. You have rapid weight gain or loss. You vomit blood or material that looks like coffee grounds. You develop a severe headache. You have shortness of breath. You have any kind of trauma, such as from a fall or a car accident. Summary The first trimester of pregnancy is from week 1 until the end of week 13 (months 1 through 3). Your body goes through many changes during pregnancy. The changes vary from woman to woman. You will have routine prenatal visits. During those visits, your health care provider will examine you, discuss any test results you may have, and talk  with you about how you are feeling. This information is not intended to replace advice given to you by your health care provider. Make sure you discuss any questions you have with your health care provider. Document Released: 04/01/2001 Document Revised: 03/19/2016 Document Reviewed: 03/19/2016 Elsevier Interactive Patient Education  2017 [employer] Vaginal Bleeding During Pregnancy, First Trimester A small amount of bleeding (spotting) from the vagina is relatively common in early pregnancy. It usually stops on its own. Various things may cause bleeding or spotting in early pregnancy. Some bleeding may be related to the pregnancy, and some may not. In most cases, the bleeding is normal and is not a problem. However, bleeding can also be a sign of something serious. Be sure to tell your health care provider about any vaginal bleeding right away. Some  possible causes of vaginal bleeding during the first trimester include:  Infection or inflammation of the cervix.  Growths (polyps) on the cervix.  Miscarriage or threatened miscarriage.  Pregnancy tissue has developed outside of the uterus and in a fallopian tube (tubal pregnancy).  Tiny cysts have developed in the uterus instead of pregnancy tissue (molar pregnancy). Follow these instructions at home: Watch your condition for any changes. The following actions may help to lessen any discomfort you are feeling:  Follow your health care provider's instructions for limiting your activity. If your health care provider orders bed rest, you may need to stay in bed and only get up to use the bathroom. However, your health care provider may allow you to continue light activity.  If needed, make plans for someone to help with your regular activities and responsibilities while you are on bed rest.  Keep track of the number of pads you use each day, how often you change pads, and how soaked (saturated) they are. Write this down.  Do not use tampons.  Do not douche.  Do not have sexual intercourse or orgasms until approved by your health care provider.  If you pass any tissue from your vagina, save the tissue so you can show it to your health care provider.  Only take over-the-counter or prescription medicines as directed by your health care provider.  Do not take aspirin because it can make you bleed.  Keep all follow-up appointments as directed by your health care provider. Contact a health care provider if:  You have any vaginal bleeding during any part of your pregnancy.  You have cramps or labor pains.  You have a fever, not controlled by medicine. Get help right away if:  You have severe cramps in your back or belly (abdomen).  You pass large clots or tissue from your vagina.  Your bleeding increases.  You feel light-headed or weak, or you have fainting episodes.  You have chills.  You are leaking fluid or have a gush of fluid from your vagina.  You pass out while having a bowel movement. This information is not intended to replace advice given to you by your health care provider. Make sure you discuss any questions you have with your health care provider. Document Released: 01/15/2005 Document Revised: 09/13/2015 Document Reviewed: 12/13/2012 Elsevier Interactive Patient Education  2017 Elsevier Inc.  Smoking Tobacco Information Smoking tobacco will very likely harm your health. Tobacco contains a poisonous (toxic), colorless chemical called nicotine. Nicotine affects the brain and makes tobacco addictive. This change in your brain can make it hard to stop smoking. Tobacco also has other toxic chemicals that can hurt your body and raise your risk of many cancers. How can smoking tobacco affect me? Smoking tobacco can increase your chances of having serious health conditions, such as:  Cancer. Smoking is most commonly associated with lung cancer, but can lead to cancer in other parts of the body.  Chronic  obstructive pulmonary disease (COPD). This is a long-term lung condition that makes it hard to breathe. It also gets worse over time.  High blood pressure (hypertension), heart disease, stroke, or heart attack.  Lung infections, such as pneumonia.  Cataracts. This is when the lenses in the eyes become clouded.  Digestive problems. This may include peptic ulcers, heartburn, and gastroesophageal reflux disease (GERD).  Oral health problems, such as gum disease and tooth loss.  Loss of taste and smell. Smoking can affect your appearance by causing:  Wrinkles.  Yellow  or stained teeth, fingers, and fingernails. Smoking tobacco can also affect your social life.  Many workplaces, [employer], hotels, and public places are tobacco-free. This means that you may experience challenges in finding places to smoke when away from home.  The cost of a smoking habit can be expensive. Expenses for someone who smokes come in two ways:  You spend money on a regular basis to buy tobacco.  Your health care costs in the long-term are higher if you smoke.  Tobacco smoke can also affect the health of those around you. Children of smokers have greater chances of:  Sudden infant death syndrome (SIDS).  Ear infections.  Lung infections. What lifestyle changes can be made?  Do not start smoking. Quit if you already do.  To quit smoking:  Make a plan to quit smoking and commit yourself to it. Look for programs to help you and ask your health care provider for recommendations and ideas.  Talk with your health care provider about using nicotine replacement medicines to help you quit. Medicine replacement medicines include gum, lozenges, patches, sprays, or pills.  Do not replace cigarette smoking with electronic cigarettes, which are commonly called e-cigarettes. The safety of e-cigarettes is not known, and some may contain harmful chemicals.  Avoid places, people, or situations that tempt you to  smoke.  If you try to quit but return to smoking, don't give up hope. It is very common for people to try a number of times before they fully succeed. When you feel ready again, give it another try.  Quitting smoking might affect the way you eat as well as your weight. Be prepared to monitor your eating habits. Get support in planning and following a healthy diet.  Ask your health care provider about having regular tests (screenings) to check for cancer. This may include blood tests, imaging tests, and other tests.  Exercise regularly. Consider taking walks, joining a gym, or doing yoga or exercise classes.  Develop skills to manage your stress. These skills include meditation. What are the benefits of quitting smoking? By quitting smoking, you may:  Lower your risk of getting cancer and other diseases caused by smoking.  Live longer.  Breathe better.  Lower your blood pressure and heart rate.  Stop your addiction to tobacco.  Stop creating secondhand smoke that hurts other people.  Improve your sense of taste and smell.  Look better over time, due to having fewer wrinkles and less staining. What can happen if changes are not made? If you do not stop smoking, you may:  Get cancer and other diseases.  Develop COPD or other long-term (chronic) lung conditions.  Develop serious problems with your heart and blood vessels (cardiovascular system).  Need more tests to screen for problems caused by smoking.  Have higher, long-term healthcare costs from medicines or treatments related to smoking.  Continue to have worsening changes in your lungs, mouth, and nose. Where to find support: To get support to quit smoking, consider:  Asking your health care provider for more information and resources.  Taking classes to learn more about quitting smoking.  Looking for local organizations that offer resources about quitting smoking.  Joining a support group for people who want to  quit smoking in your local community. Where to find more information: You may find more information about quitting smoking from:  HelpGuide.org: www.helpguide.org/articles/addictions/how-to-quit-smoking.htm  [employer]: smokefree.gov  American Lung Association: www.lung.org Contact a health care provider if:  You have problems breathing.  Your lips, nose,  or fingers turn blue.  You have chest pain.  You are coughing up blood.  You feel faint or you pass out.  You have other noticeable changes that cause you to worry. Summary  Smoking tobacco can negatively affect your health, the health of those around you, your finances, and your social life.  Do not start smoking. Quit if you already do. If you need help quitting, ask your health care provider.  Think about joining a support group for people who want to quit smoking in your local community. There are many effective programs that will help you to quit this behavior. This information is not intended to replace advice given to you by your health care provider. Make sure you discuss any questions you have with your health care provider. Document Released: 04/22/2016 Document Revised: 04/22/2016 Document Reviewed: 04/22/2016 Elsevier Interactive Patient Education  2017 [employer].

## 2016-08-13 NOTE — Addendum Note (Signed)
Addended by: Anell Barr on: 08/13/2016 04:35 PM   Modules accepted: Orders

## 2016-08-14 ENCOUNTER — Encounter: Payer: Self-pay | Admitting: Obstetrics & Gynecology

## 2016-08-14 LAB — OBSTETRIC PANEL, INCLUDING HIV
ANTIBODY SCREEN: NEGATIVE
BASOS: 0 %
Basophils Absolute: 0 10*3/uL (ref 0.0–0.2)
EOS (ABSOLUTE): 0.1 10*3/uL (ref 0.0–0.4)
EOS: 2 %
HEMATOCRIT: 39.4 % (ref 34.0–46.6)
HEMOGLOBIN: 13.2 g/dL (ref 11.1–15.9)
HEP B S AG: NEGATIVE
HIV Screen 4th Generation wRfx: NONREACTIVE
IMMATURE GRANS (ABS): 0 10*3/uL (ref 0.0–0.1)
IMMATURE GRANULOCYTES: 0 %
LYMPHS: 29 %
Lymphocytes Absolute: 2.3 10*3/uL (ref 0.7–3.1)
MCH: 29.3 pg (ref 26.6–33.0)
MCHC: 33.5 g/dL (ref 31.5–35.7)
MCV: 88 fL (ref 79–97)
Monocytes Absolute: 0.5 10*3/uL (ref 0.1–0.9)
Monocytes: 7 %
NEUTROS PCT: 62 %
Neutrophils Absolute: 4.8 10*3/uL (ref 1.4–7.0)
Platelets: 218 10*3/uL (ref 150–379)
RBC: 4.5 x10E6/uL (ref 3.77–5.28)
RDW: 14.4 % (ref 12.3–15.4)
RH TYPE: POSITIVE
RPR: NONREACTIVE
RUBELLA: 13.1 {index} (ref >0.99)
WBC: 7.8 10*3/uL (ref 3.4–10.8)

## 2016-08-14 LAB — SICKLE CELL SCREEN: Sickle Cell Screen: NEGATIVE

## 2016-08-16 LAB — CULTURE, URINE COMPREHENSIVE

## 2016-08-18 LAB — CYTOLOGY - PAP
CHLAMYDIA, DNA PROBE: NEGATIVE
DIAGNOSIS: NEGATIVE
Neisseria Gonorrhea: NEGATIVE

## 2016-08-24 IMAGING — US US TRANSVAGINAL NON-OB
1 series · 13 of 25 positions shown · non-contrast
Comparison: None.

CLINICAL DATA: Pelvic pain for 3 days.

EXAM:
TRANSABDOMINAL AND TRANSVAGINAL ULTRASOUND OF PELVIS
DOPPLER ULTRASOUND OF OVARIES
TECHNIQUE: Both transabdominal and transvaginal ultrasound examinations of the
pelvis were performed. Transabdominal technique was performed for
global imaging of the pelvis including uterus, ovaries, adnexal
regions, and pelvic cul-de-sac.
It was necessary to proceed with endovaginal exam following the
transabdominal exam to visualize the ovaries. Color and duplex
Doppler ultrasound was utilized to evaluate blood flow to the
ovaries.

[Series 1: us transvaginal non-ob · 0.22mm/px · 13 of 126 slices shown]
[im 1/126]
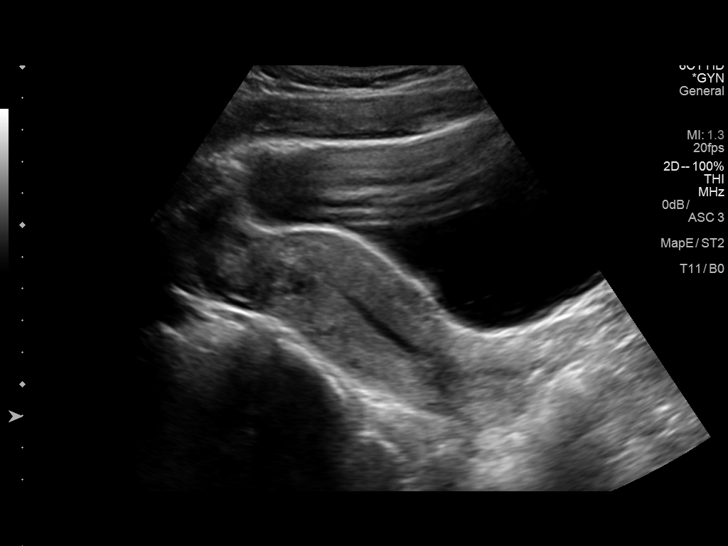
[im 11/126]
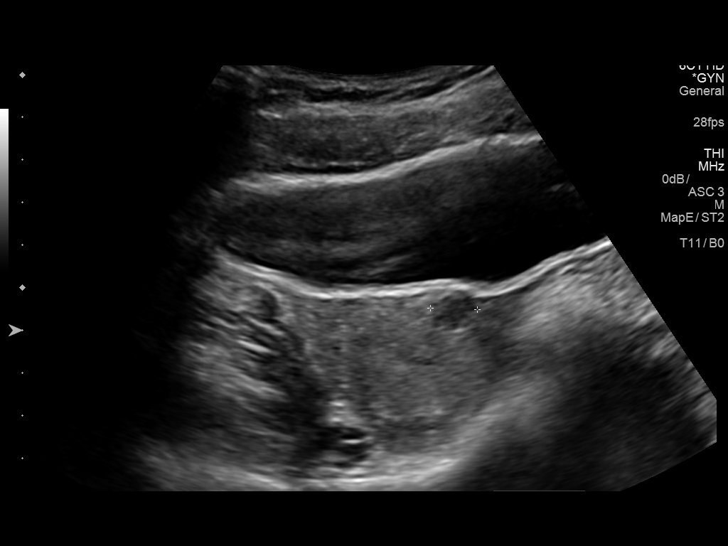
[im 21/126]
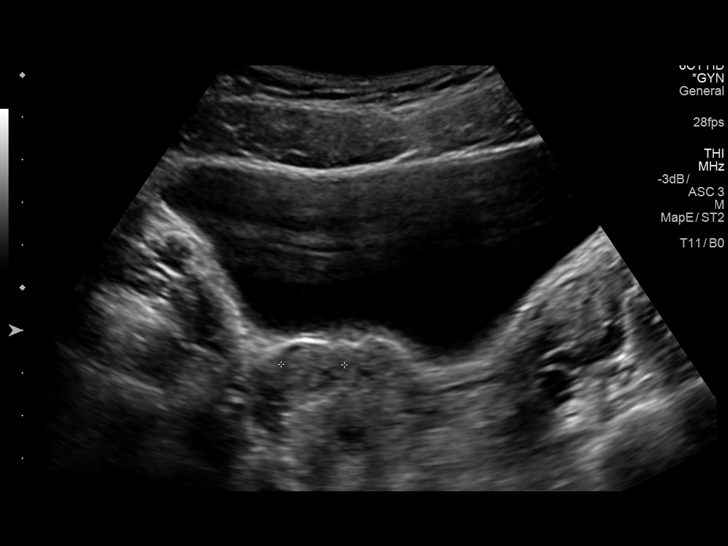
[im 32/126]
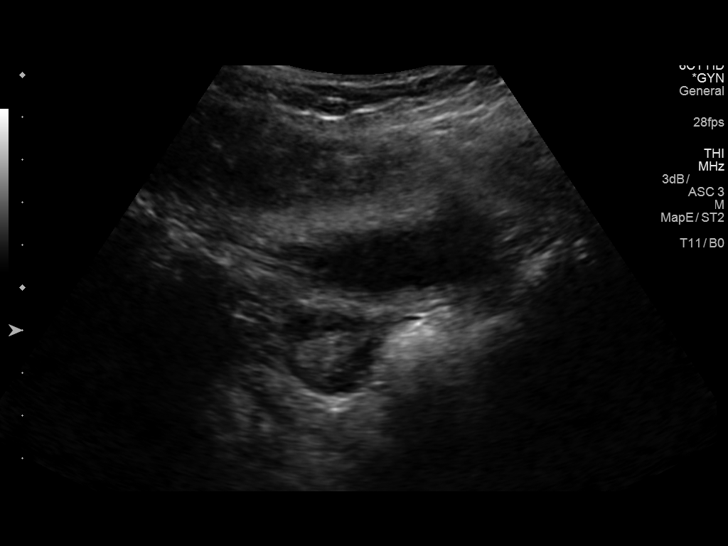
[im 42/126]
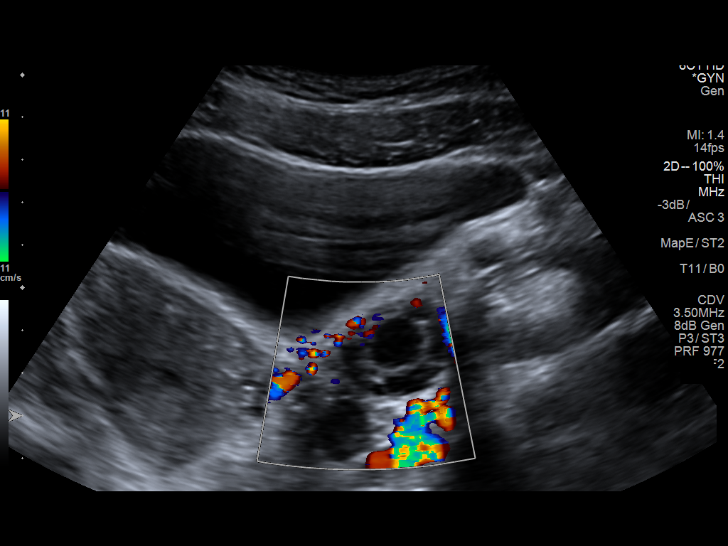
[im 53/126]
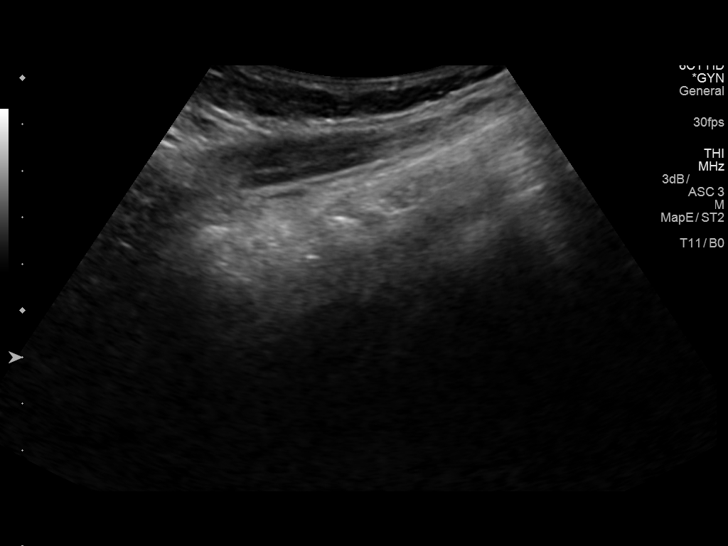
[im 63/126]
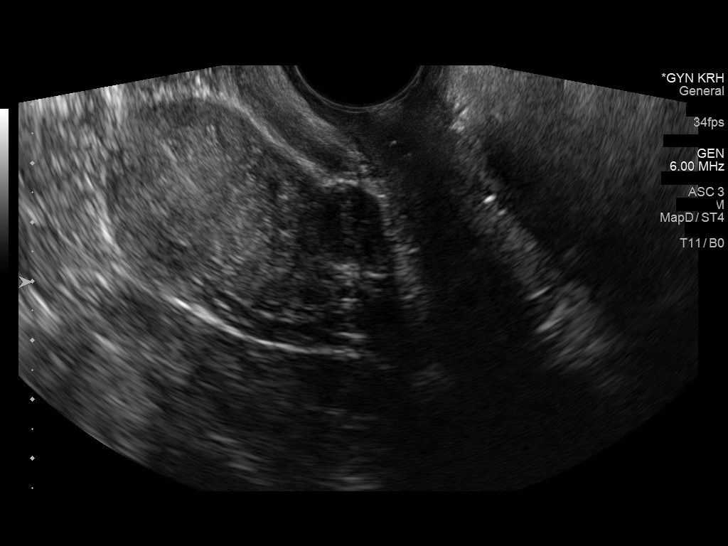
[im 73/126]
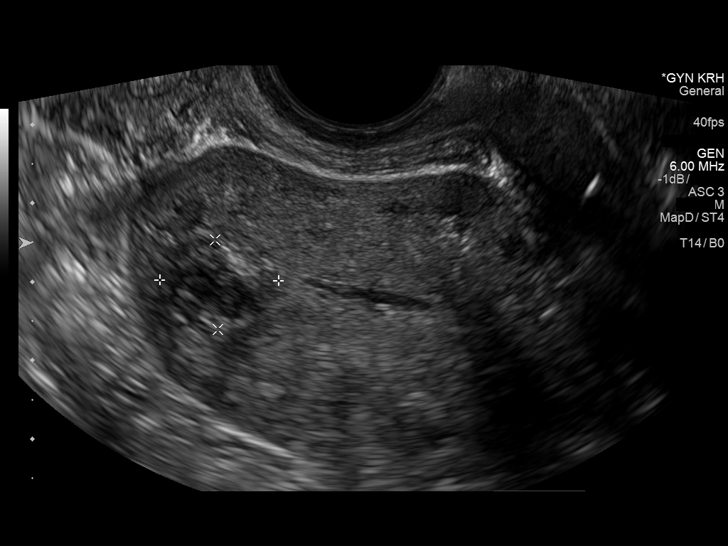
[im 84/126]
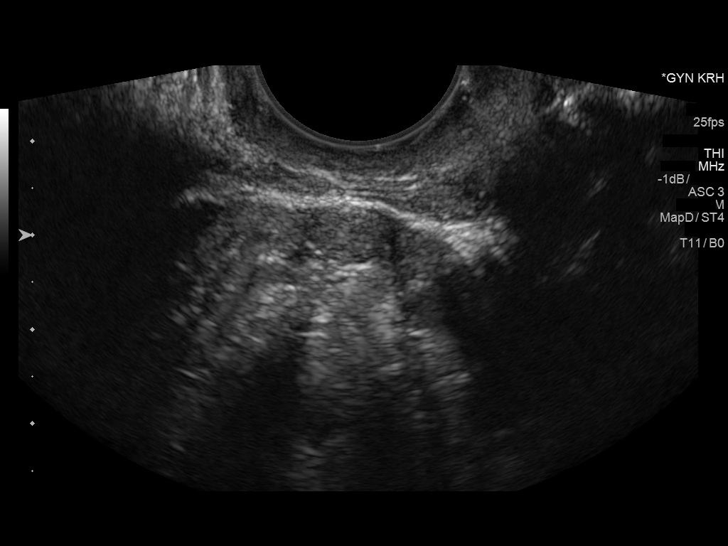
[im 94/126]
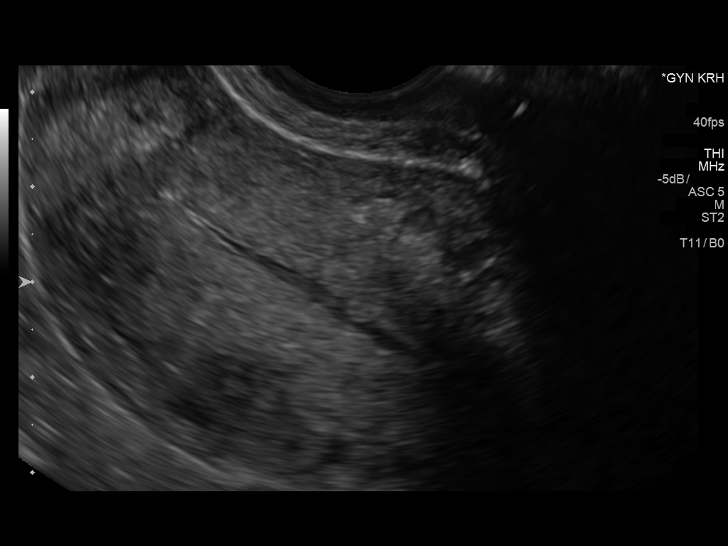
[im 105/126]
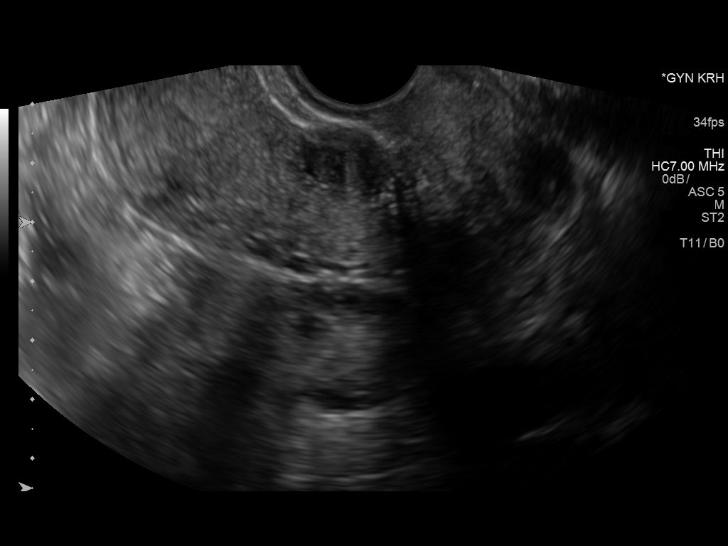
[im 115/126]
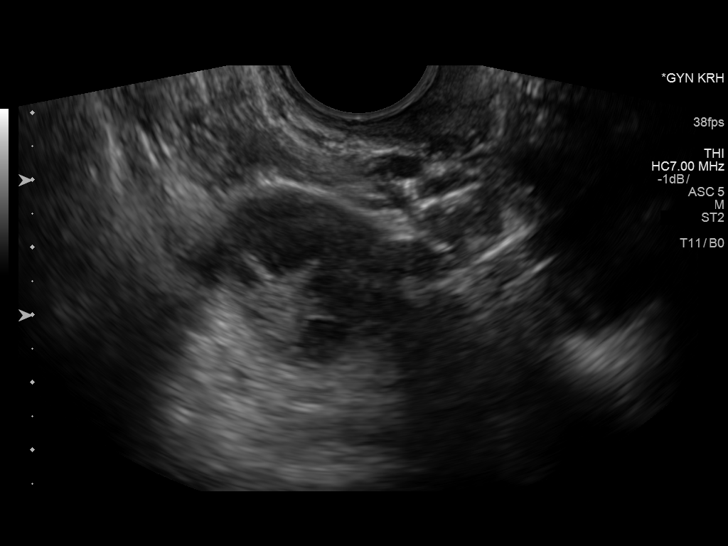
[im 126/126]
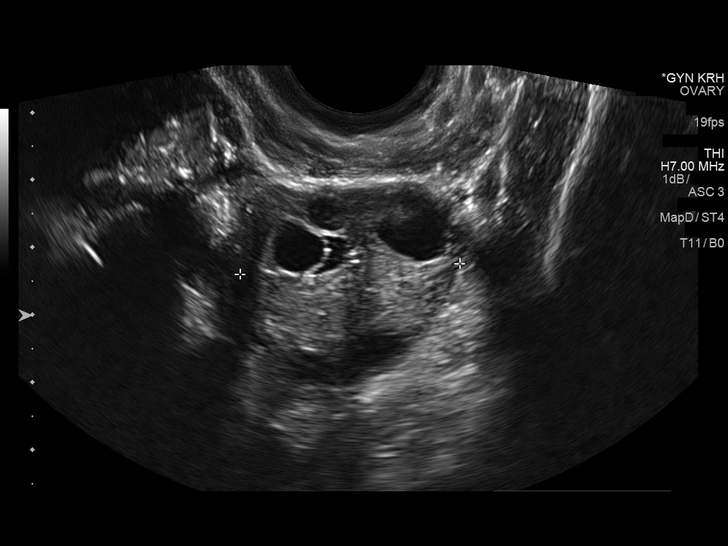

[13 of 25 positions shown; findings below may reference images not displayed]

FINDINGS: Uterus

Measurements: 9.6 x 3.3 x 4.5 cm.. There are 4 slight hypoechoic
lesions in the uterus, largest measures 1.5 x 1.7 x 1.3 cm in the
anterior myometrium. These are probably fibroadenomas. There is
question bicornuate uterus.

Endometrium

Thickness: 4 mm.. No focal abnormality visualized. There is fluid in
the endometrial canal. There is a question 3 mm isoechoic focus in
the endometrial canal, questioned polyp.

Right ovary

Measurements: 4.4 x 2.1 x 2.5 cm. Normal appearance/no adnexal mass.

Left ovary

Measurements: 4.4 x 3.1 x 3.3 cm. Normal appearance/no adnexal mass.

Pulsed Doppler evaluation of both ovaries demonstrates normal
low-resistance arterial and venous waveforms.

Other findings

No free fluid. There is a 1.8 x 1.5 x 1.4 cm cyst adjacent to the
left ovary.
IMPRESSION: Uterine fibroids.

Fluid in endometrial canal. Question polyp in the endometrial canal.

## 2016-09-02 ENCOUNTER — Encounter: Payer: Medicaid Other | Admitting: Certified Nurse Midwife

## 2016-09-12 ENCOUNTER — Ambulatory Visit (INDEPENDENT_AMBULATORY_CARE_PROVIDER_SITE_OTHER): Payer: Medicaid Other | Admitting: Family Medicine

## 2016-09-12 VITALS — BP 128/82 | HR 80 | Wt 171.0 lb

## 2016-09-12 DIAGNOSIS — Z3402 Encounter for supervision of normal first pregnancy, second trimester: Secondary | ICD-10-CM

## 2016-09-12 NOTE — Progress Notes (Signed)
Overall joint pain

## 2016-09-12 NOTE — Progress Notes (Signed)
   PRENATAL VISIT NOTE  Subjective:  Kristine Oconnell is a 26 y.o. G3P0020 at 5558w6d being seen today for ongoing prenatal care.  She is currently monitored for the following issues for this low-risk pregnancy and has Supervision of normal first pregnancy, antepartum and Tobacco smoking affecting pregnancy, antepartum on her problem list.  Patient reports no complaints.   . Vag. Bleeding: None.   . Denies leaking of fluid.   The following portions of the patient's history were reviewed and updated as appropriate: allergies, current medications, past family history, past medical history, past social history, past surgical history and problem list. Problem list updated.  Objective:   Vitals:   09/12/16 1026  BP: 128/82  Pulse: 80  Weight: 171 lb (77.6 kg)    Fetal Status: Fetal Heart Rate (bpm): 147         General:  Alert, oriented and cooperative. Patient is in no acute distress.  Skin: Skin is warm and dry. No rash noted.   Cardiovascular: Normal heart rate noted  Respiratory: Normal respiratory effort, no problems with respiration noted  Abdomen: Soft, gravid, appropriate for gestational age. Pain/Pressure: Absent     Pelvic:  Cervical exam deferred        Extremities: Normal range of motion.  Edema: None  Mental Status: Normal mood and affect. Normal behavior. Normal judgment and thought content.   Assessment and Plan:  Pregnancy: G3P0020 at 4158w6d  1. Encounter for supervision of normal first pregnancy in second trimester FHT and FH normal - AFP, Quad Screen  Preterm labor symptoms and general obstetric precautions including but not limited to vaginal bleeding, contractions, leaking of fluid and fetal movement were reviewed in detail with the patient. Please refer to After Visit Summary for other counseling recommendations.  No Follow-up on file.   Levie HeritageJacob J Keymiah Lyles, DO

## 2016-09-17 LAB — AFP, QUAD SCREEN
AFP: 38.4 ng/mL
Curr Gest Age: 15.9 weeks
HCG, Total: 57.56 IU/mL
INH: 133.6 pg/mL
INTERPRETATION-AFP: NEGATIVE
MOM FOR HCG: 1.42
MoM for AFP: 1.1
MoM for INH: 0.8
OPEN SPINA BIFIDA: NEGATIVE
Osb Risk: 1:18800 {titer}
TRI 18 SCR RISK EST: NEGATIVE
Trisomy 18 (Edward) Syndrome Interp.: 1:118000 {titer}
UE3 VALUE: 0.81 ng/mL
uE3 Mom: 1.14

## 2016-09-23 ENCOUNTER — Encounter (HOSPITAL_COMMUNITY): Payer: Self-pay | Admitting: Family Medicine

## 2016-09-25 ENCOUNTER — Ambulatory Visit (HOSPITAL_COMMUNITY): Payer: Medicaid Other

## 2016-09-29 ENCOUNTER — Ambulatory Visit (HOSPITAL_COMMUNITY)
Admission: RE | Admit: 2016-09-29 | Discharge: 2016-09-29 | Disposition: A | Discharge status: Home / Self Care | Payer: Medicaid Other | Source: Ambulatory Visit | Attending: Family Medicine | Admitting: Family Medicine

## 2016-09-29 DIAGNOSIS — Z3689 Encounter for other specified antenatal screening: Secondary | ICD-10-CM | POA: Diagnosis not present

## 2016-09-29 DIAGNOSIS — Z3A18 18 weeks gestation of pregnancy: Secondary | ICD-10-CM | POA: Insufficient documentation

## 2016-09-29 DIAGNOSIS — O99332 Smoking (tobacco) complicating pregnancy, second trimester: Secondary | ICD-10-CM | POA: Insufficient documentation

## 2016-09-29 DIAGNOSIS — Z3400 Encounter for supervision of normal first pregnancy, unspecified trimester: Secondary | ICD-10-CM

## 2016-09-29 DIAGNOSIS — O99330 Smoking (tobacco) complicating pregnancy, unspecified trimester: Secondary | ICD-10-CM

## 2016-10-06 ENCOUNTER — Encounter: Payer: Medicaid Other | Admitting: Obstetrics & Gynecology

## 2016-10-10 ENCOUNTER — Encounter: Payer: Medicaid Other | Admitting: Obstetrics & Gynecology

## 2016-10-15 ENCOUNTER — Ambulatory Visit (INDEPENDENT_AMBULATORY_CARE_PROVIDER_SITE_OTHER): Payer: Medicaid Other | Admitting: Obstetrics & Gynecology

## 2016-10-15 VITALS — BP 119/74 | HR 82 | Wt 189.0 lb

## 2016-10-15 DIAGNOSIS — Z3482 Encounter for supervision of other normal pregnancy, second trimester: Secondary | ICD-10-CM

## 2016-10-15 DIAGNOSIS — O99330 Smoking (tobacco) complicating pregnancy, unspecified trimester: Secondary | ICD-10-CM

## 2016-10-15 DIAGNOSIS — Z3400 Encounter for supervision of normal first pregnancy, unspecified trimester: Secondary | ICD-10-CM

## 2016-10-15 DIAGNOSIS — O99332 Smoking (tobacco) complicating pregnancy, second trimester: Secondary | ICD-10-CM

## 2016-10-15 NOTE — Progress Notes (Signed)
   PRENATAL VISIT NOTE  Subjective:  Kristine Oconnell is a 26 y.o. G3P0020 at 5744w4d being seen today for ongoing prenatal care.  She is currently monitored for the following issues for this low-risk pregnancy and has Supervision of normal first pregnancy, antepartum and Tobacco smoking affecting pregnancy, antepartum on her problem list.  Patient reports no complaints.   . Vag. Bleeding: None.  Movement: Increased. Denies leaking of fluid.   The following portions of the patient's history were reviewed and updated as appropriate: allergies, current medications, past family history, past medical history, past social history, past surgical history and problem list. Problem list updated.  Objective:   Vitals:   10/15/16 1557  BP: 119/74  Pulse: 82  Weight: 189 lb (85.7 kg)    Fetal Status: Fetal Heart Rate (bpm): 160   Movement: Increased     General:  Alert, oriented and cooperative. Patient is in no acute distress.  Skin: Skin is warm and dry. No rash noted.   Cardiovascular: Normal heart rate noted  Respiratory: Normal respiratory effort, no problems with respiration noted  Abdomen: Soft, gravid, appropriate for gestational age. Pain/Pressure: Absent     Pelvic:  Cervical exam deferred        Extremities: Normal range of motion.  Edema: None  Mental Status: Normal mood and affect. Normal behavior. Normal judgment and thought content.   Assessment and Plan:  Pregnancy: G3P0020 at 5544w4d  1. Supervision of normal first pregnancy, antepartum  2. THC smoking affecting pregnancy, antepartum  Preterm labor symptoms and general obstetric precautions including but not limited to vaginal bleeding, contractions, leaking of fluid and fetal movement were reviewed in detail with the patient. Please refer to After Visit Summary for other counseling recommendations.  Return in about 4 weeks (around 11/12/2016).   Willodean Rosenthalarolyn Harraway-Smith, MD

## 2016-10-15 NOTE — Patient Instructions (Signed)

## 2016-11-07 ENCOUNTER — Telehealth: Payer: Self-pay

## 2016-11-07 NOTE — Telephone Encounter (Signed)
Patient complaining of hard to eat and feeling nauseous and lightheaded. Patient states she sluggish.    After speaking with the patient she states that she just started a new job and is having sweats at night. Patent also confessed that her aunt just told her she doesn't want a newborn baby in the house and she is going to have to find another place to stay. Patient also states that she has trouble getting food sometimes. Paitent has appointment on Monday and we will set up pregnancy care manager to speak with her. Armandina StammerJennifer Amaria Mundorf RNBSN

## 2016-11-10 ENCOUNTER — Ambulatory Visit (INDEPENDENT_AMBULATORY_CARE_PROVIDER_SITE_OTHER): Payer: Medicaid Other | Admitting: Family Medicine

## 2016-11-10 VITALS — BP 125/79 | HR 97 | Wt 179.0 lb

## 2016-11-10 DIAGNOSIS — Z3400 Encounter for supervision of normal first pregnancy, unspecified trimester: Secondary | ICD-10-CM

## 2016-11-10 DIAGNOSIS — Z3402 Encounter for supervision of normal first pregnancy, second trimester: Secondary | ICD-10-CM

## 2016-11-10 NOTE — Addendum Note (Signed)
Addended by: Anell BarrHOWARD, Maleia Weems L on: 11/10/2016 11:29 AM   Modules accepted: Orders

## 2016-11-10 NOTE — Progress Notes (Signed)
   PRENATAL VISIT NOTE  Subjective:  Kristine Oconnell is a 26 y.o. G3P0020 at 5570w2d being seen today for ongoing prenatal care.  She is currently monitored for the following issues for this low-risk pregnancy and has Supervision of normal first pregnancy, antepartum and Tobacco smoking affecting pregnancy, antepartum on her problem list.  Patient reports no complaints.   . Vag. Bleeding: None.  Movement: Present. Denies leaking of fluid.   The following portions of the patient's history were reviewed and updated as appropriate: allergies, current medications, past family history, past medical history, past social history, past surgical history and problem list. Problem list updated.  Objective:   Vitals:   11/10/16 1027  BP: 125/79  Pulse: 97  Weight: 179 lb (81.2 kg)    Fetal Status: Fetal Heart Rate (bpm): 147   Movement: Present     General:  Alert, oriented and cooperative. Patient is in no acute distress.  Skin: Skin is warm and dry. No rash noted.   Cardiovascular: Normal heart rate noted  Respiratory: Normal respiratory effort, no problems with respiration noted  Abdomen: Soft, gravid, appropriate for gestational age.  Pain/Pressure: Absent     Pelvic: Cervical exam deferred        Extremities: Normal range of motion.  Edema: None  Mental Status:  Normal mood and affect. Normal behavior. Normal judgment and thought content.   Assessment and Plan:  Pregnancy: G3P0020 at 3270w2d  1. Supervision of normal first pregnancy, antepartum FHT normal. Breaks at work. Keep hydrated, snacks to keep from having low blood sugars - feeling light headed at times.  Preterm labor symptoms and general obstetric precautions including but not limited to vaginal bleeding, contractions, leaking of fluid and fetal movement were reviewed in detail with the patient. Please refer to After Visit Summary for other counseling recommendations.  Return in about 4 weeks (around 12/08/2016) for OB f/u, 2 hr  GTT.   Levie HeritageJacob J Stinson, DO

## 2016-11-11 ENCOUNTER — Institutional Professional Consult (permissible substitution): Payer: Medicaid Other

## 2016-11-11 NOTE — BH Specialist Note (Deleted)
Integrated Behavioral Health Initial Visit  MRN: [id number] Name: Kristine Oconnell   Session Start time: *** Session End time: *** Total time: {IBH Total Time:21014050}  Type of Service: Integrated Behavioral Health- Individual/Family Interpretor:No. Interpretor Name and Language: n/a   Warm Hand Off Completed.       SUBJECTIVE: Kristine Oconnell is a 26 y.o. female accompanied by {Persons; PED relatives w/patient:19415}. Patient was referred by Dr Adrian BlackwaterStinson for anxiety. Patient reports the following symptoms/concerns: *** Duration of problem: ***; Severity of problem: {Mild/Moderate/Severe:20260}  OBJECTIVE: Mood: {BHH MOOD:22306} and Affect: {BHH AFFECT:22307} Risk of harm to self or others: No plan to harm self or others   LIFE CONTEXT: Family and Social: *** School/Work: *** Self-Care: *** Life Changes: Current pregnancy ***  GOALS ADDRESSED: Patient will reduce symptoms of: {IBH Symptoms:21014056} and increase knowledge and/or ability of: {IBH Patient Tools:21014057} and also: {IBH Goals:21014053}   INTERVENTIONS: {IBH Interventions:21014054}  Standardized Assessments completed: GAD-7 and PHQ 9  ASSESSMENT: Patient currently experiencing ***. Patient may benefit from psychoeducation and brief therapeutic interventions regarding coping with ***.  PLAN: 1. Follow up with behavioral health clinician on : *** 2. Behavioral recommendations:  -*** -*** -*** 3. Referral(s): {IBH Referrals:21014055} 4. "From scale of 1-10, how likely are you to follow plan?": ***  Jamie C McMannes, LCSWA

## 2016-11-12 ENCOUNTER — Telehealth: Payer: Self-pay | Admitting: Clinical

## 2016-11-12 NOTE — Telephone Encounter (Signed)
Follow-up after no-show on 11-11-16; [facility]

## 2016-11-14 NOTE — BH Specialist Note (Deleted)
Integrated Behavioral Health Initial Visit  MRN: [id number] Name: Kristine Oconnell Casten   Session Start time: *** Session End time: *** Total time: {IBH Total Time:21014050}  Type of Service: Integrated Behavioral Health- Individual/Family Interpretor:{yes [id number]}no:[id number]} Interpretor Name and Language: ***   Warm Hand Off Completed.       SUBJECTIVE: Kristine Oconnell Michelle is a 26 y.o. female accompanied by {Persons; PED relatives w/patient:19415}. Patient was referred by *** for ***. Patient reports the following symptoms/concerns: *** Duration of problem: ***; Severity of problem: {Mild/Moderate/Severe:20260}  OBJECTIVE: Mood: {BHH MOOD:22306} and Affect: {BHH AFFECT:22307} Risk of harm to self or others: {CHL AMB BH Suicide Current Mental Status:21022748}   LIFE CONTEXT: Family and Social: *** School/Work: *** Self-Care: *** Life Changes: ***  GOALS ADDRESSED: Patient will reduce symptoms of: {IBH Symptoms:21014056} and increase knowledge and/or ability of: {IBH Patient Tools:21014057} and also: {IBH Goals:21014053}   INTERVENTIONS: {IBH Interventions:21014054}  Standardized Assessments completed: {IBH Screening Tools:21014051}  ASSESSMENT: Patient currently experiencing ***. Patient may benefit from ***.  PLAN: 1. Follow up with behavioral health clinician on : *** 2. Behavioral recommendations: *** 3. Referral(s): {IBH Referrals:21014055} 4. "From scale of 1-10, how likely are you to follow plan?": ***  Handy Mcloud C Ria Redcay, LCSWA

## 2016-11-17 ENCOUNTER — Institutional Professional Consult (permissible substitution): Payer: Medicaid Other

## 2016-12-08 ENCOUNTER — Ambulatory Visit (INDEPENDENT_AMBULATORY_CARE_PROVIDER_SITE_OTHER): Payer: Medicaid Other | Admitting: Family Medicine

## 2016-12-08 VITALS — BP 137/83 | HR 92 | Wt 188.0 lb

## 2016-12-08 DIAGNOSIS — Z3493 Encounter for supervision of normal pregnancy, unspecified, third trimester: Secondary | ICD-10-CM

## 2016-12-08 DIAGNOSIS — Z3490 Encounter for supervision of normal pregnancy, unspecified, unspecified trimester: Secondary | ICD-10-CM

## 2016-12-08 NOTE — Progress Notes (Signed)
   PRENATAL VISIT NOTE  Subjective:  Kristine Oconnell is a 26 y.o. G3P0020 at [redacted]w[redacted]d being seen today for ongoing prenatal care.  She is currently monitored for the following issues for this low-risk pregnancy and has Supervision of normal first pregnancy, antepartum and Tobacco smoking affecting pregnancy, antepartum on her problem list.  Patient reports no complaints.  Contractions: Not present. Vag. Bleeding: None.  Movement: Present. Denies leaking of fluid.   The following portions of the patient's history were reviewed and updated as appropriate: allergies, current medications, past family history, past medical history, past social history, past surgical history and problem list. Problem list updated.  Objective:   Vitals:   12/08/16 0942  BP: 137/83  Pulse: 92  Weight: 188 lb (85.3 kg)    Fetal Status: Fetal Heart Rate (bpm): 145   Movement: Present     General:  Alert, oriented and cooperative. Patient is in no acute distress.  Skin: Skin is warm and dry. No rash noted.   Cardiovascular: Normal heart rate noted  Respiratory: Normal respiratory effort, no problems with respiration noted  Abdomen: Soft, gravid, appropriate for gestational age.  Pain/Pressure: Present     Pelvic: Cervical exam deferred        Extremities: Normal range of motion.  Edema: None  Mental Status:  Normal mood and affect. Normal behavior. Normal judgment and thought content.   Assessment and Plan:  Pregnancy: G3P0020 at [redacted]w[redacted]d  1. Prenatal care, antepartum Discussed birthing classes, BF classes - Glucose Tolerance, 2 Hours w/1 Hour - RPR - HIV antibody (with reflex) - CBC  Preterm labor symptoms and general obstetric precautions including but not limited to vaginal bleeding, contractions, leaking of fluid and fetal movement were reviewed in detail with the patient. Please refer to After Visit Summary for other counseling recommendations.  Return in about 2 weeks (around 12/22/2016).   Levie Heritage, DO

## 2016-12-09 LAB — RPR: RPR Ser Ql: NONREACTIVE

## 2016-12-09 LAB — CBC
HEMATOCRIT: 33.6 % — AB (ref 34.0–46.6)
Hemoglobin: 11.7 g/dL (ref 11.1–15.9)
MCH: 30.3 pg (ref 26.6–33.0)
MCHC: 34.8 g/dL (ref 31.5–35.7)
MCV: 87 fL (ref 79–97)
PLATELETS: 159 10*3/uL (ref 150–379)
RBC: 3.86 x10E6/uL (ref 3.77–5.28)
RDW: 13.7 % (ref 12.3–15.4)
WBC: 7.8 10*3/uL (ref 3.4–10.8)

## 2016-12-09 LAB — GLUCOSE TOLERANCE, 2 HOURS W/ 1HR
GLUCOSE, 2 HOUR: 79 mg/dL (ref 65–152)
Glucose, 1 hour: 108 mg/dL (ref 65–179)
Glucose, Fasting: 67 mg/dL (ref 65–91)

## 2016-12-09 LAB — HIV ANTIBODY (ROUTINE TESTING W REFLEX): HIV Screen 4th Generation wRfx: NONREACTIVE

## 2016-12-24 ENCOUNTER — Encounter: Payer: Medicaid Other | Admitting: Obstetrics & Gynecology

## 2017-01-01 ENCOUNTER — Encounter: Payer: Medicaid Other | Admitting: Family Medicine

## 2017-01-05 ENCOUNTER — Ambulatory Visit (INDEPENDENT_AMBULATORY_CARE_PROVIDER_SITE_OTHER): Payer: Medicaid Other | Admitting: Family Medicine

## 2017-01-05 VITALS — BP 132/85 | HR 100 | Wt 201.0 lb

## 2017-01-05 DIAGNOSIS — Z3483 Encounter for supervision of other normal pregnancy, third trimester: Secondary | ICD-10-CM

## 2017-01-05 DIAGNOSIS — Z3400 Encounter for supervision of normal first pregnancy, unspecified trimester: Secondary | ICD-10-CM

## 2017-01-05 NOTE — Progress Notes (Signed)
   PRENATAL VISIT NOTE  Subjective:  Kristine Oconnell is a 26 y.o. G3P0020 at [redacted]w[redacted]d being seen today for ongoing prenatal care.  She is currently monitored for the following issues for this low-risk pregnancy and has Supervision of normal first pregnancy, antepartum and Tobacco smoking affecting pregnancy, antepartum on her problem list.  Patient reports no complaints.  Contractions: Not present. Vag. Bleeding: None.  Movement: Present. Denies leaking of fluid.   The following portions of the patient's history were reviewed and updated as appropriate: allergies, current medications, past family history, past medical history, past social history, past surgical history and problem list. Problem list updated.  Objective:   Vitals:   01/05/17 1057  BP: 132/85  Pulse: 100  Weight: 201 lb (91.2 kg)    Fetal Status: Fetal Heart Rate (bpm): 143 Fundal Height: 33 cm Movement: Present     General:  Alert, oriented and cooperative. Patient is in no acute distress.  Skin: Skin is warm and dry. No rash noted.   Cardiovascular: Normal heart rate noted  Respiratory: Normal respiratory effort, no problems with respiration noted  Abdomen: Soft, gravid, appropriate for gestational age.  Pain/Pressure: Present     Pelvic: Cervical exam deferred        Extremities: Normal range of motion.  Edema: None  Mental Status:  Normal mood and affect. Normal behavior. Normal judgment and thought content.   Assessment and Plan:  Pregnancy: G3P0020 at [redacted]w[redacted]d  1. Supervision of normal first pregnancy, antepartum FHT and FH normal  Preterm labor symptoms and general obstetric precautions including but not limited to vaginal bleeding, contractions, leaking of fluid and fetal movement were reviewed in detail with the patient. Please refer to After Visit Summary for other counseling recommendations.  No Follow-up on file.   Levie Heritage, DO

## 2017-01-19 ENCOUNTER — Ambulatory Visit (INDEPENDENT_AMBULATORY_CARE_PROVIDER_SITE_OTHER): Payer: Medicaid Other | Admitting: Family Medicine

## 2017-01-19 VITALS — BP 128/84 | HR 96 | Wt 198.0 lb

## 2017-01-19 DIAGNOSIS — Z3483 Encounter for supervision of other normal pregnancy, third trimester: Secondary | ICD-10-CM

## 2017-01-19 DIAGNOSIS — Z3400 Encounter for supervision of normal first pregnancy, unspecified trimester: Secondary | ICD-10-CM

## 2017-01-19 NOTE — Progress Notes (Signed)
   PRENATAL VISIT NOTE  Subjective:  Julitza Oconnell is a 26 y.o. G3P0020 at [redacted]w[redacted]d being seen today for ongoing prenatal care.  She is currently monitored for the following issues for this low-risk pregnancy and has Supervision of normal first pregnancy, antepartum and Tobacco smoking affecting pregnancy, antepartum on her problem list.  Patient reports no complaints.  Contractions: Not present. Vag. Bleeding: None.  Movement: Present. Denies leaking of fluid.   The following portions of the patient's history were reviewed and updated as appropriate: allergies, current medications, past family history, past medical history, past social history, past surgical history and problem list. Problem list updated.  Objective:   Vitals:   01/19/17 1048  BP: 128/84  Pulse: 96  Weight: 198 lb (89.8 kg)    Fetal Status: Fetal Heart Rate (bpm): 155   Movement: Present     General:  Alert, oriented and cooperative. Patient is in no acute distress.  Skin: Skin is warm and dry. No rash noted.   Cardiovascular: Normal heart rate noted  Respiratory: Normal respiratory effort, no problems with respiration noted  Abdomen: Soft, gravid, appropriate for gestational age.  Pain/Pressure: Present     Pelvic: Cervical exam deferred        Extremities: Normal range of motion.  Edema: None  Mental Status:  Normal mood and affect. Normal behavior. Normal judgment and thought content.   Assessment and Plan:  Pregnancy: G3P0020 at [redacted]w[redacted]d  1. Supervision of normal first pregnancy, antepartum FHT and FH. Discussed birthing classes. Interested in waterbirth  Preterm labor symptoms and general obstetric precautions including but not limited to vaginal bleeding, contractions, leaking of fluid and fetal movement were reviewed in detail with the patient. Please refer to After Visit Summary for other counseling recommendations.  Return in about 2 weeks (around 02/02/2017).   Levie Heritage, DO

## 2017-01-19 NOTE — Patient Instructions (Signed)
Thinking About Waterbirth???  You must attend a Waterbirth class at [facility]  3rd Wednesday of every month from 7-9pm  Free  Register by calling [phone] or online at [email or web address]  Bring us the certificate from the class  Waterbirth supplies needed for [facility] Department patients:  Our practice has a Birth Pool in a Box tub at the hospital that you can borrow  You will need to purchase an accessory kit that has all needed supplies through [facility] ([phone]) or online $175.00  Or you can purchase the supplies separately: o Single-use disposable tub liner for Birth Pool in a Box (REGULAR size) o New garden hose labeled "lead-free", "suitable for drinking water", o Electric drain pump to remove water (We recommend 792 gallon per hour or greater pump.)  o  "non-toxic" OR "water potable" o Garden hose to remove the dirty water o Fish net o Bathing suit top (optional) o Long-handled mirror (optional)  [employer] sells tubs for ~ $120 if you would rather purchase your own tub.  They also sell accessories, liners.    Www.waterbirthsolutions.com for tub purchases and supplies  [employer] (www.thelaborladies.com) $275 for tub rental/set-up & take down/kit   [employer] [employer] information regarding doulas (labor support) who provide pool rentals:  Http://www.padanc.org/MeetUs.htm   [employer] (www.thelaborladies.com)  Http://www.padanc.org/MeetUs.htm   Things that would prevent you from having a waterbirth:  Premature, <37wks  Previous cesarean birth  Presence of thick meconium-stained fluid  Multiple gestation (Twins, triplets, etc.)  Uncontrolled diabetes or gestational diabetes requiring medication  Hypertension  Heavy vaginal bleeding  Non-reassuring fetal heart rate  Active infection (MRSA, etc.)  If your labor has to be induced and induction method requires continuous  monitoring of the baby's heart rate  Other risks/issues identified by your obstetrical provider   

## 2017-01-19 NOTE — Progress Notes (Signed)
Patient declined flu shot. Armandina Stammer RNBSN

## 2017-02-02 ENCOUNTER — Ambulatory Visit (INDEPENDENT_AMBULATORY_CARE_PROVIDER_SITE_OTHER): Payer: Medicaid Other | Admitting: Obstetrics & Gynecology

## 2017-02-02 ENCOUNTER — Other Ambulatory Visit (HOSPITAL_COMMUNITY)
Admission: RE | Admit: 2017-02-02 | Discharge: 2017-02-02 | Disposition: A | Discharge status: Home / Self Care | Payer: Medicaid Other | Source: Ambulatory Visit | Attending: Obstetrics & Gynecology | Admitting: Obstetrics & Gynecology

## 2017-02-02 VITALS — BP 119/82 | HR 96 | Wt 208.0 lb

## 2017-02-02 DIAGNOSIS — A6000 Herpesviral infection of urogenital system, unspecified: Secondary | ICD-10-CM | POA: Insufficient documentation

## 2017-02-02 DIAGNOSIS — Z3400 Encounter for supervision of normal first pregnancy, unspecified trimester: Secondary | ICD-10-CM

## 2017-02-02 DIAGNOSIS — O99333 Smoking (tobacco) complicating pregnancy, third trimester: Secondary | ICD-10-CM | POA: Insufficient documentation

## 2017-02-02 DIAGNOSIS — O99330 Smoking (tobacco) complicating pregnancy, unspecified trimester: Secondary | ICD-10-CM

## 2017-02-02 DIAGNOSIS — A6009 Herpesviral infection of other urogenital tract: Secondary | ICD-10-CM

## 2017-02-02 DIAGNOSIS — O98319 Other infections with a predominantly sexual mode of transmission complicating pregnancy, unspecified trimester: Secondary | ICD-10-CM

## 2017-02-02 DIAGNOSIS — O98313 Other infections with a predominantly sexual mode of transmission complicating pregnancy, third trimester: Secondary | ICD-10-CM | POA: Insufficient documentation

## 2017-02-02 DIAGNOSIS — Z3403 Encounter for supervision of normal first pregnancy, third trimester: Secondary | ICD-10-CM | POA: Insufficient documentation

## 2017-02-02 MED ORDER — VALACYCLOVIR HCL 500 MG PO TABS: 500.0000 mg | ORAL_TABLET | Freq: Two times a day (BID) | ORAL | 6 refills | Status: DC

## 2017-02-02 NOTE — Progress Notes (Signed)
   PRENATAL VISIT NOTE  Subjective:  Kristine Oconnell is a 26 y.o. G3P0020 at [redacted]w[redacted]d being seen today for ongoing prenatal care.  She is currently monitored for the following issues for this low-risk pregnancy and has Supervision of normal first pregnancy, antepartum; Tobacco smoking affecting pregnancy, antepartum; and Genital herpes affecting pregnancy, antepartum on her problem list.  Patient reports no complaints.  Contractions: Not present. Vag. Bleeding: None.  Movement: Present. Denies leaking of fluid.   The following portions of the patient's history were reviewed and updated as appropriate: allergies, current medications, past family history, past medical history, past social history, past surgical history and problem list. Problem list updated.  Objective:   Vitals:   02/02/17 1021  BP: 119/82  Pulse: 96  Weight: 208 lb (94.3 kg)    Fetal Status: Fetal Heart Rate (bpm): 145   Movement: Present     General:  Alert, oriented and cooperative. Patient is in no acute distress.  Skin: Skin is warm and dry. No rash noted.   Cardiovascular: Normal heart rate noted  Respiratory: Normal respiratory effort, no problems with respiration noted  Abdomen: Soft, gravid, appropriate for gestational age.  Pain/Pressure: Absent     Pelvic: Cervical exam performed        Extremities: Normal range of motion.     Mental Status:  Normal mood and affect. Normal behavior. Normal judgment and thought content.   Assessment and Plan:  Pregnancy: G3P0020 at [redacted]w[redacted]d  1. Supervision of normal first pregnancy, antepartum  - Strep Gp B NAA - Urine cytology ancillary only  2. Tobacco smoking affecting pregnancy, antepartum Pt has had no tob use for several months. She is worried about the 2nd hand tob from her SO  3. Genital herpes affecting pregnancy, antepartum Begin Valtrex for suppression  Preterm labor symptoms and general obstetric precautions including but not limited to vaginal bleeding,  contractions, leaking of fluid and fetal movement were reviewed in detail with the patient. Please refer to After Visit Summary for other counseling recommendations.  Return in about 1 week (around 02/09/2017).   Willodean Rosenthal, MD

## 2017-02-02 NOTE — Patient Instructions (Signed)
Group B Streptococcus Infection During Pregnancy Group B Streptococcus (GBS) is a type of bacteria (Streptococcus agalactiae) that is often found in healthy people, commonly in the rectum, vagina, and intestines. In people who are healthy and not pregnant, the bacteria rarely cause serious illness or complications. However, women who test positive for GBS during pregnancy can pass the bacteria to their baby during childbirth, which can cause serious infection in the baby after birth. Women with GBS may also have infections during their pregnancy or immediately after childbirth, such as such as urinary tract infections (UTIs) or infections of the uterus (uterine infections). Having GBS also increases a woman's risk of complications during pregnancy, such as early (preterm) labor or delivery, miscarriage, or stillbirth. Routine testing (screening) for GBS is recommended for all pregnant women. What increases the risk? You may have a higher risk for GBS infection during pregnancy if you had one during a past pregnancy. What are the signs or symptoms? In most cases, GBS infection does not cause symptoms in pregnant women. Signs and symptoms of a possible GBS-related infection may include:  Labor starting before the 37th week of pregnancy.  A UTI or bladder infection, which may cause: ? Fever. ? Pain or burning during urination. ? Frequent urination.  Fever during labor, along with: ? Bad-smelling discharge. ? Uterine tenderness. ? Rapid heartbeat in the mother, baby, or both.  Rare but serious symptoms of a possible GBS-related infection in women include:  Blood infection (septicemia). This may cause fever, chills, or confusion.  Lung infection (pneumonia). This may cause fever, chills, cough, rapid breathing, difficulty breathing, or chest pain.  Bone, joint, skin, or soft tissue infection.  How is this diagnosed? You may be screened for GBS between week 35 and week 37 of your pregnancy. If  you have symptoms of preterm labor, you may be screened earlier. This condition is diagnosed based on lab test results from:  A swab of fluid from the vagina and rectum.  A urine sample.  How is this treated? This condition is treated with antibiotic medicine. When you go into labor, or as soon as your water breaks (your membranes rupture), you will be given antibiotics through an IV tube. Antibiotics will continue until after you give birth. If you are having a cesarean delivery, you do not need antibiotics unless your membranes have already ruptured. Follow these instructions at home:  Take over-the-counter and prescription medicines only as told by your health care provider.  Take your antibiotic medicine as told by your health care provider. Do not stop taking the antibiotic even if you start to feel better.  Keep all pre-birth (prenatal) visits and follow-up visits as told by your health care provider. This is important. Contact a health care provider if:  You have pain or burning when you urinate.  You have to urinate frequently.  You have a fever or chills.  You develop a bad-smelling vaginal discharge. Get help right away if:  Your membranes rupture.  You go into labor.  You have severe pain in your abdomen.  You have difficulty breathing.  You have chest pain. This information is not intended to replace advice given to you by your health care provider. Make sure you discuss any questions you have with your health care provider. Document Released: 07/15/2007 Document Revised: 11/02/2015 Document Reviewed: 11/01/2015 Elsevier Interactive Patient Education  2018 [employer]  

## 2017-02-03 LAB — OB RESULTS CONSOLE GBS: STREP GROUP B AG: NEGATIVE

## 2017-02-03 LAB — URINE CYTOLOGY ANCILLARY ONLY
CHLAMYDIA, DNA PROBE: NEGATIVE
Neisseria Gonorrhea: NEGATIVE
Trichomonas: NEGATIVE

## 2017-02-04 LAB — STREP GP B NAA: Strep Gp B NAA: NEGATIVE

## 2017-02-09 ENCOUNTER — Encounter: Payer: Medicaid Other | Admitting: Family Medicine

## 2017-02-10 ENCOUNTER — Encounter: Payer: Medicaid Other | Admitting: Advanced Practice Midwife

## 2017-02-11 ENCOUNTER — Inpatient Hospital Stay (HOSPITAL_COMMUNITY)
Admission: AD | Admit: 2017-02-11 | Discharge: 2017-02-15 | DRG: 806 | Disposition: A | Discharge status: Home / Self Care | Payer: Medicaid Other | Source: Ambulatory Visit | Attending: Obstetrics & Gynecology | Admitting: Obstetrics & Gynecology

## 2017-02-11 ENCOUNTER — Encounter (HOSPITAL_COMMUNITY): Payer: Self-pay

## 2017-02-11 DIAGNOSIS — O133 Gestational [pregnancy-induced] hypertension without significant proteinuria, third trimester: Secondary | ICD-10-CM

## 2017-02-11 DIAGNOSIS — Z8759 Personal history of other complications of pregnancy, childbirth and the puerperium: Secondary | ICD-10-CM | POA: Diagnosis present

## 2017-02-11 DIAGNOSIS — Z3A37 37 weeks gestation of pregnancy: Secondary | ICD-10-CM | POA: Diagnosis not present

## 2017-02-11 DIAGNOSIS — D649 Anemia, unspecified: Secondary | ICD-10-CM | POA: Diagnosis present

## 2017-02-11 DIAGNOSIS — O134 Gestational [pregnancy-induced] hypertension without significant proteinuria, complicating childbirth: Secondary | ICD-10-CM | POA: Diagnosis present

## 2017-02-11 DIAGNOSIS — A6000 Herpesviral infection of urogenital system, unspecified: Secondary | ICD-10-CM | POA: Diagnosis present

## 2017-02-11 DIAGNOSIS — Z87891 Personal history of nicotine dependence: Secondary | ICD-10-CM

## 2017-02-11 DIAGNOSIS — O9832 Other infections with a predominantly sexual mode of transmission complicating childbirth: Secondary | ICD-10-CM | POA: Diagnosis present

## 2017-02-11 DIAGNOSIS — O9902 Anemia complicating childbirth: Secondary | ICD-10-CM | POA: Diagnosis present

## 2017-02-11 LAB — URINALYSIS, ROUTINE W REFLEX MICROSCOPIC
Bilirubin Urine: NEGATIVE
GLUCOSE, UA: NEGATIVE mg/dL
Ketones, ur: NEGATIVE mg/dL
Nitrite: NEGATIVE
Protein, ur: NEGATIVE mg/dL
Specific Gravity, Urine: 1.006 (ref 1.005–1.030)
pH: 7 (ref 5.0–8.0)

## 2017-02-11 LAB — COMPREHENSIVE METABOLIC PANEL
ALK PHOS: 138 U/L — AB (ref 38–126)
ALT: 13 U/L — ABNORMAL LOW (ref 14–54)
ANION GAP: 9 (ref 5–15)
AST: 22 U/L (ref 15–41)
Albumin: 3.1 g/dL — ABNORMAL LOW (ref 3.5–5.0)
BILIRUBIN TOTAL: 0.3 mg/dL (ref 0.3–1.2)
BUN: 6 mg/dL (ref 6–20)
CO2: 21 mmol/L — ABNORMAL LOW (ref 22–32)
Calcium: 8.4 mg/dL — ABNORMAL LOW (ref 8.9–10.3)
Chloride: 102 mmol/L (ref 101–111)
Creatinine, Ser: 0.51 mg/dL (ref 0.44–1.00)
GFR calc non Af Amer: >60 mL/min (ref >60)
GLUCOSE: 92 mg/dL (ref 65–99)
Potassium: 3.5 mmol/L (ref 3.5–5.1)
Sodium: 132 mmol/L — ABNORMAL LOW (ref 135–145)
TOTAL PROTEIN: 7 g/dL (ref 6.5–8.1)

## 2017-02-11 LAB — CBC
HEMATOCRIT: 31.9 % — AB (ref 36.0–46.0)
HEMOGLOBIN: 10.9 g/dL — AB (ref 12.0–15.0)
MCH: 29.9 pg (ref 26.0–34.0)
MCHC: 34.2 g/dL (ref 30.0–36.0)
MCV: 87.6 fL (ref 78.0–100.0)
Platelets: 156 10*3/uL (ref 150–400)
RBC: 3.64 MIL/uL — ABNORMAL LOW (ref 3.87–5.11)
RDW: 13.3 % (ref 11.5–15.5)
WBC: 7.5 10*3/uL (ref 4.0–10.5)

## 2017-02-11 LAB — PROTEIN / CREATININE RATIO, URINE
CREATININE, URINE: 47 mg/dL
PROTEIN CREATININE RATIO: 0.13 mg/mg{creat} (ref 0.00–0.15)
Total Protein, Urine: 6 mg/dL

## 2017-02-11 LAB — TYPE AND SCREEN
ABO/RH(D): O POS
Antibody Screen: NEGATIVE

## 2017-02-11 LAB — ABO/RH: ABO/RH(D): O POS

## 2017-02-11 MED ORDER — LACTATED RINGERS IV SOLN
INTRAVENOUS | Status: DC
  Administered 2017-02-11 – 2017-02-12 (×5): via INTRAVENOUS

## 2017-02-11 MED ORDER — LACTATED RINGERS IV SOLN
500.0000 mL | INTRAVENOUS | Status: DC | PRN
Start: 2017-02-11 — End: 2017-02-13

## 2017-02-11 MED ORDER — FENTANYL CITRATE (PF) 100 MCG/2ML IJ SOLN
100.0000 ug | INTRAMUSCULAR | Status: DC | PRN
  Administered 2017-02-12 (×2): 100 ug via INTRAVENOUS
  Filled 2017-02-11 (×2): qty 2

## 2017-02-11 MED ORDER — MISOPROSTOL 50MCG HALF TABLET
50.0000 ug | ORAL_TABLET | ORAL | Status: DC | PRN
  Administered 2017-02-11 – 2017-02-12 (×2): 50 ug via ORAL
  Filled 2017-02-11 (×3): qty 1

## 2017-02-11 MED ORDER — TERBUTALINE SULFATE 1 MG/ML IJ SOLN: 0.2500 mg | Freq: Once | INTRAMUSCULAR | Status: DC | PRN

## 2017-02-11 MED ORDER — VALACYCLOVIR HCL 500 MG PO TABS
500.0000 mg | ORAL_TABLET | Freq: Two times a day (BID) | ORAL | Status: DC
  Filled 2017-02-11 (×5): qty 1

## 2017-02-11 MED ORDER — LIDOCAINE HCL (PF) 1 % IJ SOLN
30.0000 mL | INTRAMUSCULAR | Status: DC | PRN
  Filled 2017-02-11: qty 30

## 2017-02-11 MED ORDER — OXYTOCIN 40 UNITS IN LACTATED RINGERS INFUSION - SIMPLE MED
2.5000 [IU]/h | INTRAVENOUS | Status: DC
  Administered 2017-02-13: 2.5 [IU]/h via INTRAVENOUS
  Filled 2017-02-11: qty 1000

## 2017-02-11 MED ORDER — ACETAMINOPHEN 325 MG PO TABS: 650.0000 mg | ORAL_TABLET | ORAL | Status: DC | PRN

## 2017-02-11 MED ORDER — OXYCODONE-ACETAMINOPHEN 5-325 MG PO TABS: 2.0000 | ORAL_TABLET | ORAL | Status: DC | PRN

## 2017-02-11 MED ORDER — ONDANSETRON HCL 4 MG/2ML IJ SOLN
4.0000 mg | Freq: Four times a day (QID) | INTRAMUSCULAR | Status: DC | PRN
  Administered 2017-02-12: 4 mg via INTRAVENOUS
  Filled 2017-02-11: qty 2

## 2017-02-11 MED ORDER — FLEET ENEMA 7-19 GM/118ML RE ENEM: 1.0000 | ENEMA | RECTAL | Status: DC | PRN

## 2017-02-11 MED ORDER — MISOPROSTOL 25 MCG QUARTER TABLET: 25.0000 ug | ORAL_TABLET | ORAL | Status: DC | PRN

## 2017-02-11 MED ORDER — SOD CITRATE-CITRIC ACID 500-334 MG/5ML PO SOLN: 30.0000 mL | ORAL | Status: DC | PRN

## 2017-02-11 MED ORDER — OXYCODONE-ACETAMINOPHEN 5-325 MG PO TABS: 1.0000 | ORAL_TABLET | ORAL | Status: DC | PRN

## 2017-02-11 MED ORDER — OXYTOCIN BOLUS FROM INFUSION
500.0000 mL | Freq: Once | INTRAVENOUS | Status: AC
  Administered 2017-02-13: 500 mL via INTRAVENOUS

## 2017-02-11 NOTE — H&P (Signed)
LABOR AND DELIVERY ADMISSION HISTORY AND PHYSICAL NOTE  Kristine Oconnell is a 26 y.o. female G3P0020 with IUP at [redacted]w[redacted]d by 1st trimester U/S who presented to MAU d/t URI, but noted to have elevated BPs being admitted for IOL for gestational hypertension. She denies any there headache, visual disturbances, or RUQ/epigastric pain. She reports good fetal movement. She denies leakage of fluid or vaginal bleeding.  Prenatal History/Complications: PNC at [facility] at [facility] Pregnancy complications:  - HSV - Tobacco smoking  Past Medical History: Past Medical History:  Diagnosis Date  . Hernia, inguinal   . Vaginal Pap smear, abnormal     Past Surgical History: Past Surgical History:  Procedure Laterality Date  . HERNIA REPAIR      Obstetrical History: OB History    Gravida Para Term Preterm AB Living   3 0     2     SAB TAB Ectopic Multiple Live Births   1 1            Social History: Social History   Social History  . Marital status: Single    Spouse name: N/A  . Number of children: N/A  . Years of education: N/A   Social History Main Topics  . Smoking status: Former Games developer  . Smokeless tobacco: Never Used     Comment: passive smoke exposure  . Alcohol use Yes     Comment: occ  . Drug use: No  . Sexual activity: Yes   Other Topics Concern  . None   Social History Narrative  . None    Family History: Family History  Problem Relation Age of Onset  . Hypertension Mother   . Diabetes Maternal Aunt   . Hypertension Maternal Aunt   . Cancer Maternal Grandmother        breast  . Cancer Maternal Grandfather   . Stroke Maternal Grandfather     Allergies: Allergies  Allergen Reactions  . Sulfa Antibiotics Rash    Prescriptions Prior to Admission  Medication Sig Dispense Refill Last Dose  . valACYclovir (VALTREX) 500 MG tablet Take 1 tablet (500 mg total) by mouth 2 (two) times daily. 60 tablet 6 02/09/2017     Review of Systems  All systems reviewed and  negative except as stated in HPI, expect for: - Rhinorrhea, congestion, sore throat. No fever, chills, N/V  Physical Exam Patient Vitals for the past 4 hrs:  BP Temp Temp src Pulse Resp  02/11/17 1300 (!) 139/92 - - 96 -  02/11/17 1245 (!) 137/91 - - 97 -  02/11/17 1230 (!) 153/99 - - (!) 101 -  02/11/17 1215 (!) 139/94 - - 95 -  02/11/17 1200 (!) 126/92 - - 95 -  02/11/17 1145 (!) 146/98 - - (!) 101 -  02/11/17 1131 (!) 145/99 - - (!) 104 -  02/11/17 1116 (!) 144/93 - - 99 -  02/11/17 1100 (!) 140/97 - - (!) 103 -  02/11/17 1054 (!) 153/94 - - (!) 102 -  02/11/17 1047 (!) 142/91 98.2 F (36.8 C) Oral (!) 108 20    General appearance: alert, cooperative and no distress  HEENT: Lungs: clear to auscultation bilaterally Heart: regular rate and rhythm Abdomen: soft, non-tender; gravid, appropriate for GA Extremities: No calf swelling or tenderness Presentation: cephalic Fetal monitoring: baseline rate 135, moderate variability, +acel, no decel Uterine activity: uterine irritability, tracing some ctx, but pt not feeling them Dilation: Fingertip Effacement (%): Thick Exam by:: dr degele  Prenatal labs:  ABO, Rh: O/Positive/-- (04/25 1629) Antibody: Negative (04/25 1629) Rubella: 13.10 (04/25 1629) RPR: Non Reactive (08/20 1013)  HBsAg: Negative (04/25 1629)  HIV:   Nonreactive GC/Chlamydia: negative GBS: Negative (10/15 1032)  2 hr GTT: normal (67. 108, 79) Genetic screening:  Negative quad screen Anatomy US: Normal detailed anatomy at 18 weeks  Prenatal Transfer Tool  Maternal Diabetes: No Genetic Screening: Normal Maternal Ultrasounds/Referrals: Normal Fetal Ultrasounds or other Referrals:  None Maternal Substance Abuse:  No Significant Maternal Medications:  None Significant Maternal Lab Results: None  Results for orders placed or performed during the hospital encounter of 02/11/17 (from the past 24 hour(s))  Urinalysis, Routine w reflex microscopic   Collection  Time: 02/11/17 10:35 AM  Result Value Ref Range   Color, Urine YELLOW YELLOW   APPearance HAZY (A) CLEAR   Specific Gravity, Urine 1.006 1.005 - 1.030   pH 7.0 5.0 - 8.0   Glucose, UA NEGATIVE NEGATIVE mg/dL   Hgb urine dipstick SMALL (A) NEGATIVE   Bilirubin Urine NEGATIVE NEGATIVE   Ketones, ur NEGATIVE NEGATIVE mg/dL   Protein, ur NEGATIVE NEGATIVE mg/dL   Nitrite NEGATIVE NEGATIVE   Leukocytes, UA SMALL (A) NEGATIVE   RBC / HPF 0-5 0 - 5 RBC/hpf   WBC, UA 6-30 0 - 5 WBC/hpf   Bacteria, UA RARE (A) NONE SEEN   Squamous Epithelial / LPF 6-30 (A) NONE SEEN  Protein / creatinine ratio, urine   Collection Time: 02/11/17 10:35 AM  Result Value Ref Range   Creatinine, Urine 47.00 mg/dL   Total Protein, Urine 6 mg/dL   Protein Creatinine Ratio 0.13 0.00 - 0.15 mg/mg[Cre]  CBC   Collection Time: 02/11/17 11:22 AM  Result Value Ref Range   WBC 7.5 4.0 - 10.5 K/uL   RBC 3.64 (L) 3.87 - 5.11 MIL/uL   Hemoglobin 10.9 (L) 12.0 - 15.0 g/dL   HCT 16.1 (L) 09.6 - 04.5 %   MCV 87.6 78.0 - 100.0 fL   MCH 29.9 26.0 - 34.0 pg   MCHC 34.2 30.0 - 36.0 g/dL   RDW 40.9 81.1 - 91.4 %   Platelets 156 150 - 400 K/uL  Comprehensive metabolic panel   Collection Time: 02/11/17 11:22 AM  Result Value Ref Range   Sodium 132 (L) 135 - 145 mmol/L   Potassium 3.5 3.5 - 5.1 mmol/L   Chloride 102 101 - 111 mmol/L   CO2 21 (L) 22 - 32 mmol/L   Glucose, Bld 92 65 - 99 mg/dL   BUN 6 6 - 20 mg/dL   Creatinine, Ser 7.82 0.44 - 1.00 mg/dL   Calcium 8.4 (L) 8.9 - 10.3 mg/dL   Total Protein 7.0 6.5 - 8.1 g/dL   Albumin 3.1 (L) 3.5 - 5.0 g/dL   AST 22 15 - 41 U/L   ALT 13 (L) 14 - 54 U/L   Alkaline Phosphatase 138 (H) 38 - 126 U/L   Total Bilirubin 0.3 0.3 - 1.2 mg/dL   GFR calc non Af Amer >60 >60 mL/min   GFR calc Af Amer >60 >60 mL/min   Anion gap 9 5 - 15   Assessment: Kristine Oconnell is a 26 y.o. G3P0020 at [redacted]w[redacted]d being admitted for IOL for gestational HTN (140s-150s/90s). UPC 0.3. CBC and CMP nml  other than anemia.   #Labor: Start cervical ripening with cytotec. Will discuss FB placement #Pain: Per patient's request #FWB: Cat I #ID:  GBS negative. 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

## 2017-02-11 NOTE — MAU Note (Signed)
Reports sore throat and congestion for 2 days, This AM is worse. Spit up flem this morning- was yellow, with some blood maybe. Coughing and sneezing for 2 days. Reports R foot is sore on the top. No vaginal bleeding, no LOF. +fetal movement

## 2017-02-11 NOTE — Progress Notes (Signed)
Labor Progress Note Maida Salerica Rubano is a 26 y.o. G3P0020 at 1858w4d presented for IOL for gestational hypertension S: Mrs. Kemper DurieClarke is doing well and resting comfortably in bed. She reports not having a BM for 2 days. She request for a laxative  O:  BP 127/83   Pulse 92   Temp 98.2 F (36.8 C) (Oral)   Resp 20   Ht 5\' 9"  (1.753 m)   Wt 96.2 kg (212 lb)   LMP 05/24/2016 (Within Days)   BMI 31.31 kg/m  EFM: 135bpm, moderate variability, accelerations present, decelerations absent  CVE: Dilation: Fingertip Effacement (%): Thick Presentation: Vertex Exam by:: dr degele   A&P: 26 y.o. [id number] 2958w4d admitted for IOL for gestational HTN. #Labor: Progressing well. FB placement has been discussed with pt. Will place at 6pm #Pain:  Per patient request #FWB: Cat 1 #GBS Neg. Hx of HSV, on Valtrex   Steffanie RainwaterProsper M Amponsah, Medical Student 5:11 PM  I have read and agree with the above medical student note.  Jules Schickim Talmadge Ganas, DO PGY-1, [facility]

## 2017-02-11 NOTE — Anesthesia Pain Management Evaluation Note (Signed)
  CRNA Pain Management Visit Note  Patient: Kristine Oconnell, 26 y.o., female  "Hello I am a [facility] of the anesthesia team at [facility]. We have an anesthesia team available at all times to provide care throughout the hospital, including epidural management and anesthesia for C-section. I don't know your plan for the delivery whether it a natural birth, water birth, IV sedation, nitrous supplementation, doula or epidural, but we want to meet your pain goals."   1.Was your pain managed to your expectations on prior hospitalizations?   Yes   2.What is your expectation for pain management during this hospitalization?     Labor support without medications  3.How can we help you reach that goal? Nursing intervention  Record the patient's initial score and the patient's pain goal.   Pain: 0/10  Pain Goal: 10/10 [facility] wants you to be able to say your pain was always managed very well.  Salome ArntSterling, Leondra Cullin Marie 02/11/2017

## 2017-02-11 NOTE — Progress Notes (Signed)
Labor Progress Note Kristine Oconnell is a 26 y.o. G3P0020 at 6741w4d presented for IOL for gestational hypertension. S: Pt resting comfortably in bed. States that she is not in pain at the moment.   O:  BP (!) 141/85   Pulse 86   Temp 98.5 F (36.9 C) (Oral)   Resp 16   Ht 5\' 9"  (1.753 m)   Wt 212 lb (96.2 kg)   LMP 05/24/2016 (Within Days)   BMI 31.31 kg/m  EFM: 130/mod vari/ +accels  CVE: Dilation: 1 Effacement (%): Thick Station: -2 Presentation: Vertex Exam by:: dr degele    A&P: 26 y.o. [id number] 3141w4d here for  IOL for gestational hypertension. #Labor: Progressing well.  #Pain: (does not report any pain at the moment) per patient request #FWB: cat 1 #GBS negative #gHTN: BP elevated but controled with last being 141/95.  Continue to monitor patient #HSV-2: pt refused valtrex and is aware of indications and risks of refusal  Suella BroadKeriann S Atiyana Welte, MD 10:36 PM

## 2017-02-11 NOTE — Progress Notes (Signed)
Patient refusing Valtrex. Reports she thinks this was making her sick, and she stopped taking it. Discussed indications and risks. She voiced understanding.

## 2017-02-12 ENCOUNTER — Inpatient Hospital Stay (HOSPITAL_COMMUNITY): Payer: Medicaid Other | Admitting: Anesthesiology

## 2017-02-12 LAB — CBC
HEMATOCRIT: 32.5 % — AB (ref 36.0–46.0)
Hemoglobin: 10.9 g/dL — ABNORMAL LOW (ref 12.0–15.0)
MCH: 29.4 pg (ref 26.0–34.0)
MCHC: 33.5 g/dL (ref 30.0–36.0)
MCV: 87.6 fL (ref 78.0–100.0)
Platelets: 167 10*3/uL (ref 150–400)
RBC: 3.71 MIL/uL — ABNORMAL LOW (ref 3.87–5.11)
RDW: 13.2 % (ref 11.5–15.5)
WBC: 10.8 10*3/uL — ABNORMAL HIGH (ref 4.0–10.5)

## 2017-02-12 LAB — RPR: RPR: NONREACTIVE

## 2017-02-12 MED ORDER — EPHEDRINE 5 MG/ML INJ: 10.0000 mg | INTRAVENOUS | Status: DC | PRN

## 2017-02-12 MED ORDER — TERBUTALINE SULFATE 1 MG/ML IJ SOLN: 0.2500 mg | Freq: Once | INTRAMUSCULAR | Status: DC | PRN

## 2017-02-12 MED ORDER — LIDOCAINE HCL (PF) 1 % IJ SOLN
INTRAMUSCULAR | Status: DC | PRN
  Administered 2017-02-12: 6 mL via EPIDURAL
  Administered 2017-02-12: 4 mL

## 2017-02-12 MED ORDER — OXYTOCIN 40 UNITS IN LACTATED RINGERS INFUSION - SIMPLE MED
1.0000 m[IU]/min | INTRAVENOUS | Status: DC
  Administered 2017-02-12: 2 m[IU]/min via INTRAVENOUS

## 2017-02-12 MED ORDER — FENTANYL 2.5 MCG/ML BUPIVACAINE 1/10 % EPIDURAL INFUSION (WH - ANES)
14.0000 mL/h | INTRAMUSCULAR | Status: DC | PRN
  Administered 2017-02-12: 14 mL/h via EPIDURAL
  Filled 2017-02-12: qty 100

## 2017-02-12 MED ORDER — PHENYLEPHRINE 40 MCG/ML (10ML) SYRINGE FOR IV PUSH (FOR BLOOD PRESSURE SUPPORT): 80.0000 ug | PREFILLED_SYRINGE | INTRAVENOUS | Status: DC | PRN

## 2017-02-12 MED ORDER — PHENYLEPHRINE 40 MCG/ML (10ML) SYRINGE FOR IV PUSH (FOR BLOOD PRESSURE SUPPORT)
80.0000 ug | PREFILLED_SYRINGE | INTRAVENOUS | Status: DC | PRN
  Filled 2017-02-12: qty 10

## 2017-02-12 MED ORDER — LACTATED RINGERS IV SOLN
500.0000 mL | Freq: Once | INTRAVENOUS | Status: AC
  Administered 2017-02-12: 500 mL via INTRAVENOUS

## 2017-02-12 MED ORDER — DIPHENHYDRAMINE HCL 50 MG/ML IJ SOLN: 12.5000 mg | INTRAMUSCULAR | Status: DC | PRN

## 2017-02-12 NOTE — Anesthesia Preprocedure Evaluation (Signed)
Anesthesia Evaluation  Patient identified by MRN, date of birth, ID band Patient awake    Reviewed: Allergy & Precautions, H&P , Patient's Chart, lab work & pertinent test results, reviewed documented beta blocker date and time   Airway Mallampati: II  TM Distance: >3 FB Neck ROM: full    Dental no notable dental hx.    Pulmonary former smoker,    Pulmonary exam normal breath sounds clear to auscultation       Cardiovascular  Rhythm:regular Rate:Normal     Neuro/Psych    GI/Hepatic   Endo/Other    Renal/GU      Musculoskeletal   Abdominal   Peds  Hematology   Anesthesia Other Findings   Reproductive/Obstetrics                             Anesthesia Physical Anesthesia Plan  ASA: II  Anesthesia Plan: Epidural   Post-op Pain Management:    Induction:   PONV Risk Score and Plan:   Airway Management Planned:   Additional Equipment:   Intra-op Plan:   Post-operative Plan:   Informed Consent: I have reviewed the patients History and Physical, chart, labs and discussed the procedure including the risks, benefits and alternatives for the proposed anesthesia with the patient or authorized representative who has indicated his/her understanding and acceptance.   Dental Advisory Given  Plan Discussed with: CRNA and Surgeon  Anesthesia Plan Comments: (Labs checked- platelets confirmed with RN in room. Fetal heart tracing, per RN, reported to be stable enough for sitting procedure. Discussed epidural, and patient consents to the procedure:  included risk of possible headache,backache, failed block, allergic reaction, and nerve injury. This patient was asked if she had any questions or concerns before the procedure started.)        Anesthesia Quick Evaluation  

## 2017-02-12 NOTE — Anesthesia Procedure Notes (Signed)
Epidural Patient location during procedure: OB  Staffing Anesthesiologist: Kyndal Gloster  Preanesthetic Checklist Completed: patient identified, pre-op evaluation, timeout performed, IV checked, risks and benefits discussed and monitors and equipment checked  Epidural Patient position: sitting Prep: DuraPrep Patient monitoring: blood pressure and continuous pulse ox Approach: right paramedian Location: L3-L4 Injection technique: LOR air  Needle:  Needle type: Tuohy  Needle gauge: 17 G Needle insertion depth: 6 cm Catheter type: closed end flexible Catheter size: 19 Gauge Catheter at skin depth: 12 cm Test dose: negative  Assessment Sensory level: T8  Additional Notes    Dosing of Epidural:  1st dose, through catheter .............................................  Xylocaine 40 mg  2nd dose, through catheter, after waiting 3 minutes.........Xylocaine 60 mg    As each dose occurred, patient was free of IV sx; and patient exhibited no evidence of SA injection.  Patient is more comfortable after epidural dosed. Please see RN's note for documentation of vital signs,and FHR which are stable.  Patient reminded not to try to ambulate with numb legs, and that an RN must be present when she attempts to get up.         

## 2017-02-12 NOTE — Progress Notes (Addendum)
  Subjective: Kristine Oconnell is a 26 y.o. G3P0020 at 224w5d by ultrasound admitted for induction of labor due to Hypertension. She is doing well, but wants to eat and have some Colace.  Objective: BP 135/86   Pulse 75   Temp 98.3 F (36.8 C) (Oral)   Resp 20   Ht 5\' 9"  (1.753 m)   Wt 96.2 kg (212 lb)   LMP 05/24/2016 (Within Days)   BMI 31.31 kg/m  No intake/output data recorded. No intake/output data recorded.  FHT:  FHR: 135 bpm, variability: moderate,  accelerations:  Present,  decelerations:  Absent UC:   regular, every 2-3 minutes SVE:   Dilation: 5 Effacement (%): 70 Station: -3 Exam by:: Carloyn Jaeger. Maricela Schreur, CNM AROM with copious amount of clear fluid in return IUPC placed without difficulty -- pt tolerated procedure well  Labs: Lab Results  Component Value Date   WBC 7.5 02/11/2017   HGB 10.9 (L) 02/11/2017   HCT 31.9 (L) 02/11/2017   MCV 87.6 02/11/2017   PLT 156 02/11/2017    Assessment / Plan: Induction of labor due to gestational hypertension,  progressing well on pitocin  Labor: Slow progression, on pitocin Preeclampsia:  n/a Fetal Wellbeing:  Category I Pain Control:  Labor support without medications I/D:  n/a Anticipated MOD:  NSVD  Discussed with patient she cannot eat solids food while on pitocin, advised to try popsicles or jello. Patient verbalized an understanding of the plan of care and agrees.  Raelyn Moraolitta Korver Graybeal, MSN, CNM 02/12/2017, 7:06 PM

## 2017-02-12 NOTE — Progress Notes (Signed)
Labor Progress Note Kristine Oconnell is a 26 y.o. G3P0020 at 2534w5d presented for IOL for gestational HTN  S: Kristine Oconnell is resting comfortably in bed. Not in any pain at the moment  O:  BP 125/71   Pulse 77   Temp 97.8 F (36.6 C) (Oral)   Resp 20   Ht 5\' 9"  (1.753 m)   Wt 96.2 kg (212 lb)   LMP 05/24/2016 (Within Days)   BMI 31.31 kg/m  EFM: 145/moderate variability/+ accels, - decels  CVE: Dilation: 5 Effacement (%): 70 Station: -3 Presentation: Vertex Exam by: Kristine Oconnell, CNM   A&P: 26 y.o. [id number] 6934w5d presenting for IOL for gestational HTN #Labor: Progressing well. FB was removed 9:55am #Pain: Per pt request #FWB: Cat 1 #GBS negative Gestational HTN: BP under controlled, last BP 125/71 HSV-2: Pt refused valtrex and aware of indications and risk of refusal   Kristine Oconnell, Medical Student 10:01 AM

## 2017-02-13 ENCOUNTER — Encounter (HOSPITAL_COMMUNITY): Payer: Self-pay | Admitting: *Deleted

## 2017-02-13 DIAGNOSIS — Z3A37 37 weeks gestation of pregnancy: Secondary | ICD-10-CM

## 2017-02-13 DIAGNOSIS — O134 Gestational [pregnancy-induced] hypertension without significant proteinuria, complicating childbirth: Secondary | ICD-10-CM

## 2017-02-13 MED ORDER — IBUPROFEN 600 MG PO TABS
600.0000 mg | ORAL_TABLET | Freq: Four times a day (QID) | ORAL | Status: DC
  Administered 2017-02-13 – 2017-02-14 (×6): 600 mg via ORAL
  Filled 2017-02-13 (×10): qty 1

## 2017-02-13 MED ORDER — WITCH HAZEL-GLYCERIN EX PADS: 1.0000 "application " | MEDICATED_PAD | CUTANEOUS | Status: DC | PRN

## 2017-02-13 MED ORDER — PRENATAL MULTIVITAMIN CH
1.0000 | ORAL_TABLET | Freq: Every day | ORAL | Status: DC
  Administered 2017-02-13 – 2017-02-14 (×2): 1 via ORAL
  Filled 2017-02-13 (×2): qty 1

## 2017-02-13 MED ORDER — COCONUT OIL OIL
1.0000 | TOPICAL_OIL | Status: DC | PRN
Start: 2017-02-13 — End: 2017-02-15

## 2017-02-13 MED ORDER — DIPHENHYDRAMINE HCL 25 MG PO CAPS: 25.0000 mg | ORAL_CAPSULE | Freq: Four times a day (QID) | ORAL | Status: DC | PRN

## 2017-02-13 MED ORDER — SIMETHICONE 80 MG PO CHEW: 80.0000 mg | CHEWABLE_TABLET | ORAL | Status: DC | PRN

## 2017-02-13 MED ORDER — ONDANSETRON HCL 4 MG PO TABS: 4.0000 mg | ORAL_TABLET | ORAL | Status: DC | PRN

## 2017-02-13 MED ORDER — DIBUCAINE 1 % RE OINT: 1.0000 "application " | TOPICAL_OINTMENT | RECTAL | Status: DC | PRN

## 2017-02-13 MED ORDER — TETANUS-DIPHTH-ACELL PERTUSSIS 5-2.5-18.5 LF-MCG/0.5 IM SUSP: 0.5000 mL | Freq: Once | INTRAMUSCULAR | Status: DC

## 2017-02-13 MED ORDER — ACETAMINOPHEN 325 MG PO TABS
650.0000 mg | ORAL_TABLET | ORAL | Status: DC | PRN
  Administered 2017-02-14: 650 mg via ORAL
  Filled 2017-02-13: qty 2

## 2017-02-13 MED ORDER — SENNOSIDES-DOCUSATE SODIUM 8.6-50 MG PO TABS
2.0000 | ORAL_TABLET | ORAL | Status: DC
  Administered 2017-02-13 – 2017-02-14 (×2): 2 via ORAL
  Filled 2017-02-13 (×2): qty 2

## 2017-02-13 MED ORDER — BENZOCAINE-MENTHOL 20-0.5 % EX AERO
1.0000 "application " | INHALATION_SPRAY | CUTANEOUS | Status: DC | PRN
  Administered 2017-02-13: 1 via TOPICAL
  Filled 2017-02-13: qty 56

## 2017-02-13 MED ORDER — ONDANSETRON HCL 4 MG/2ML IJ SOLN: 4.0000 mg | INTRAMUSCULAR | Status: DC | PRN

## 2017-02-13 MED ORDER — ZOLPIDEM TARTRATE 5 MG PO TABS: 5.0000 mg | ORAL_TABLET | Freq: Every evening | ORAL | Status: DC | PRN

## 2017-02-13 NOTE — Progress Notes (Signed)
Finally decided to get an epidural and allowed cervical exam.  Comfortable.  Cx changed sl to 5/90/-1.  FHR cat 1.  Ctx q 1-2 minutes w/ >200 MVUs since 1900.Marland Kitchen.  Pitocin at 1718mu/min.  Will continue to monitor cx as labor has been adequate for several hours, cx starting to make change.

## 2017-02-13 NOTE — Anesthesia Postprocedure Evaluation (Signed)
Anesthesia Post Note  Patient: Kristine Oconnell  Procedure(s) Performed: AN AD HOC LABOR EPIDURAL     Patient location during evaluation: Mother Baby Anesthesia Type: Epidural Level of consciousness: awake Pain management: satisfactory to patient Vital Signs Assessment: post-procedure vital signs reviewed and stable Respiratory status: spontaneous breathing Cardiovascular status: stable Anesthetic complications: no    Last Vitals:  Vitals:   02/13/17 0628 02/13/17 1030  BP: 139/79 130/70  Pulse: 68 85  Resp: 18 18  Temp: 36.4 C 36.8 C  SpO2: 100% 99%    Last Pain:  Vitals:   02/13/17 1222  TempSrc:   PainSc: 0-No pain   Pain Goal: Patients Stated Pain Goal: 8 (02/12/17 1944)               Cephus ShellingBURGER,Brianna Esson

## 2017-02-13 NOTE — Lactation Note (Signed)
This note was copied from a baby's chart. Lactation Consultation Note  Patient Name: Kristine Oconnell [id number]'s Date: 02/13/2017 Reason for consult: Initial assessment;1st time breastfeeding;Primapara   Initial consult with mom of 10 hour old early term infant. Infant with 3 BF attempts, EBM x 1 of 7.5 cc, 1 void and 0 stools. LATCH score 4. Infant weight 7 lb 3.9 oz.   Mom reports infant is not latching well. Mom asked for assistance while LC is here. We awakened infant to feed, she awakened easily. We attempted to latch infant to the right breast in the cross cradle hold. Infant rooted briefly and took nipple in mouth and clamped down. Infant would not suckle at the breast. Infant with very tight mouth and would not suckle on gloved finger. Infant noted to be tongue thrusting on gloved finger. Mom with firm breasts with everted nipples, nipples tend to flatten with areolar compression.   Infant has spoon 7.5 ml earlier. Mom is able to self hand express and expressed 7 cc colostrum. Mom spoon fed infant 2 cc and infant stopped. We then put colostrum in curved tip syringe and infant took 3 cc with finger feeding and curved tip syringe. Mom was taught to spoon and syringe feed infant. Infant asleep after feeding.   Enc mom to offer breast at least every 2-3 hours and with first feeding cues. Enc mom to offer EBM every 2-3 hours with spoon or curved tip syringe. Enc mom to use pillow and head support with feeding.   BF Resources handout and LC Brochure given, mom informed of IP/OP Services, BF Support Services, and LC phone #. Enc mom to call out for feeding assistance as needed.   Discussed plan of care with Erin RN and asked her to set up pump if infant not feeding well later this afternoon.      Maternal Data Formula Feeding for Exclusion: No Has patient been taught Hand Expression?: Yes Does the patient have breastfeeding experience prior to this delivery?: No  Feeding Feeding Type:  Breast Fed  LATCH Score Latch: Too sleepy or reluctant, no latch achieved, no sucking elicited.  Audible Swallowing: None  Type of Nipple: Everted at rest and after stimulation  Comfort (Breast/Nipple): Filling, red/small blisters or bruises, mild/mod discomfort  Hold (Positioning): Assistance needed to correctly position infant at breast and maintain latch.  LATCH Score: 4  Interventions Interventions: Breast feeding basics reviewed;Support pillows;Assisted with latch;Position options;Skin to skin;Expressed milk;Hand express  Lactation Tools Discussed/Used WIC Program: Yes   Consult Status Consult Status: Follow-up Date: 02/14/17 Follow-up type: In-patient    Kristine Oconnell 02/13/2017, 1:09 PM

## 2017-02-14 LAB — BIRTH TISSUE RECOVERY COLLECTION (PLACENTA DONATION)

## 2017-02-14 NOTE — Progress Notes (Signed)
Pt called out requesting help with BF.  Pt helped into cross hold and pt instructed on obtaining a deep latch.  Pt hesitant to latch infant and requires repeated encouragement to hold infants head and direct infants head towards breast.  Unable to obtain a deep latch.  BF basics reviewed and position changed to football and assist with latch again.  Infant easily latches and swallows with breast compression.  Pt continues to be need encouragement to support infants head and control breast while nursing.  Importance on assisting infant with latch in early days discussed.    Hand expression reviewed with patient.  Large amount of colostrum hand expressed by pt into spoon and given to infant.  Pt encouraged to attempt at breast first with next feeding, hand express and feed to infant immediatly after, and pump with DEBP to encourage milk production.

## 2017-02-14 NOTE — Lactation Note (Signed)
This note was copied from a baby's chart. Lactation Consultation Note Called to assist mom with latch. Baby with limited tongue movement- does extend slightly beyond gumline but is unable to lift tongue. Mom reports it feels like the baby is biting. Used # 20  Baby latched well but no swallows noted and no Colostrum noted in NS when the baby came off the breast. Mom reports this is the best she has done. No pain with this latch. Mom hand expressed and spoon fed the baby approximately 2 cc's EBM. Wants to pump now since her hand is getting tired. Family members in. Mom wants to finish breakfast and then pump. Encouraged to pump after nursing to promote milk supply and feed all EBM to baby. Mom has not had any sleep through the night and is very tired. Encouraged to nap this afternoon. No questions at present. To call for assist prn  Patient Name: [name] ZOXWR'UToday's Date: 02/14/2017 Reason for consult: Follow-up assessment   Maternal Data Formula Feeding for Exclusion: No Has patient been taught Hand Expression?: Yes Does the patient have breastfeeding experience prior to this delivery?: No  Feeding Feeding Type: Breast Fed Length of feed: 15 min  LATCH Score Latch: Grasps breast easily, tongue down, lips flanged, rhythmical sucking.  Audible Swallowing: None  Type of Nipple: Everted at rest and after stimulation  Comfort (Breast/Nipple): Soft / non-tender  Hold (Positioning): Assistance needed to correctly position infant at breast and maintain latch.  LATCH Score: 7  Interventions Interventions: Breast feeding basics reviewed;Support pillows;Assisted with latch  Lactation Tools Discussed/Used Tools: Nipple Dorris CarnesShields;Pump Nipple shield size: 20 Breast pump type: Double-Electric Breast Pump   Consult Status Consult Status: Follow-up Date: 02/15/17 Follow-up type: In-patient    Kristine Oconnell, Kristine Oconnell 02/14/2017, 11:36 AM

## 2017-02-14 NOTE — Progress Notes (Signed)
POSTPARTUM PROGRESS NOTE  Post Partum Day 1  Subjective:  Kristine Oconnell is a 26 y.o. [id number] s/p NSVD at 2674w6d.  No acute events overnight.  Pt denies problems with ambulating, voiding or po intake.  She denies nausea or vomiting.  Pain is well controlled.  She has had flatus. She has not had bowel movement.  Lochia Minimal.   Objective: Blood pressure 127/69, pulse 70, temperature 98.4 F (36.9 C), temperature source Oral, resp. rate 16, height 5\' 9"  (1.753 m), weight 212 lb (96.2 kg), last menstrual period 05/24/2016, SpO2 99 %, unknown if currently breastfeeding.  Physical Exam:  General: alert, cooperative and no distress Chest: no respiratory distress Heart:regular rate, distal pulses intact Abdomen: soft, nontender,  Uterine Fundus: firm, appropriately tender DVT Evaluation: No calf swelling or tenderness Extremities: no edema Skin: warm, dry  Recent Labs  02/11/17 1122 02/12/17 1938  HGB 10.9* 10.9*  HCT 31.9* 32.5*    Assessment/Plan: Kristine Oconnell is a 26 y.o. [id number] s/p NSVD at 9774w6d   PPD# 1 - Doing well Contraception: POP Feeding: breast Dispo: Plan for discharge tomorrow.   LOS: 3 days   Lynnae PrudeKeriann S MinottMD 02/14/2017, 9:28 AM

## 2017-02-14 NOTE — Progress Notes (Signed)
CSW attempted to meet with MOB; however, MOB had many visitors. CSW will attempt at a later time.   Ercell Perlman, MSW, LCSW-A Clinical Social Worker  [facility]  Office: [phone]

## 2017-02-14 NOTE — Discharge Summary (Signed)
OB Discharge Summary     Patient Name: Kristine Oconnell DOB: 1990-06-09 MRN: [id number] Date of admission: 02/11/2017  Delivering MD: Jacklyn Shell )  Date of discharge: 02/15/2017    Admitting diagnosis: gHTN, pregnancy at 37 weeks completed gestation Intrauterine pregnancy: [redacted]w[redacted]d    Secondary diagnosis:  Active Problems:   Patient Active Problem List   Diagnosis Date Noted  . SVD (spontaneous vaginal delivery) 02/13/2017  . Gestational hypertension 02/11/2017  . Genital herpes affecting pregnancy, antepartum 02/02/2017  . Supervision of normal first pregnancy, antepartum 08/13/2016  . Tobacco smoking affecting pregnancy, antepartum 08/13/2016    Additional problems: none     Discharge diagnosis: Gestational Hypertension                                                                                                Post partum procedures:none  Augmentation: Pitocin  Complications: None  Hospital course:  Induction of Labor With Vaginal Delivery   26 y.o. yo [id number] at [redacted]w[redacted]d was admitted to the hospital 02/11/2017 for induction of labor.  Indication for induction: Gestational hypertension.  Patient had an uncomplicated labor course as follows: Membrane Rupture Time/Date: 6:50 PM ,02/12/2017   Intrapartum Procedures: Episiotomy: None [1]                                         Lacerations:  1st degree [2];Vaginal [6]  Patient had delivery of a Viable infant.  Information for the patient's newborn:  Jiah, Bari [[id number]]  Delivery Method: Vaginal, Spontaneous Delivery (Filed from Delivery Summary)   02/13/2017  Details of delivery can be found in separate delivery note.  Patient had a routine postpartum course. Patient is discharged home 02/15/17.  Physical exam  Vitals:   02/14/17 0634 02/15/17 0549  BP: 127/69 133/88  Pulse: 70 80  Resp: 16 16  Temp: 98.4 F (36.9 C) 98.2 F (36.8 C)  SpO2:  100%    General: alert, cooperative and no  distress Lochia: appropriate Uterine Fundus: firm Incision: N/A DVT Evaluation: No evidence of DVT seen on physical exam.  Labs: No results found for this or any previous visit (from the past 24 hour(s)).   Discharge instruction: per After Visit Summary and "Baby and Me Booklet".  After visit meds:  Allergies  Allergen Reactions  . Sulfa Antibiotics Rash    Allergies as of 02/15/2017      Reactions   Sulfa Antibiotics Rash      Medication List    TAKE these medications   valACYclovir 500 MG tablet Commonly known as:  VALTREX Take 1 tablet (500 mg total) by mouth 2 (two) times daily.        Diet: routine diet  Activity: Advance as tolerated. Pelvic rest for 6 weeks.   Outpatient follow up:1 week for BP check Future Appointments:  Future Appointments Date Time Provider Department Center  03/11/2017 2:15 PM Willodean Rosenthal, MD [facility] None      Postpartum contraception: Progesterone only pills  Newborn Data: APGAR (1  MIN): 8   APGAR (5 MINS): 9     Baby Feeding: Breast Disposition:home with mother  Rolm Bookbindermber Tashya Alberty, DO  02/15/2017

## 2017-02-15 MED ORDER — IBUPROFEN 600 MG PO TABS: 600.0000 mg | ORAL_TABLET | Freq: Four times a day (QID) | ORAL | 0 refills | Status: DC

## 2017-02-15 NOTE — Plan of Care (Signed)
Problem: Nutritional: Goal: Mothers verbalization of comfort with breastfeeding process will improve Outcome: Adequate for Discharge Baby will be made patient upon mom's discharge due to baby's weight loss and need to increase feedings

## 2017-02-15 NOTE — Clinical Social Work Maternal (Signed)
CLINICAL SOCIAL WORK MATERNAL/CHILD NOTE  Patient Details  Name: Kristine Oconnell MRN: [id number] Date of Birth: 07/13/1990  Date:  02/15/2017  Clinical Social Worker Initiating Note:  Jasline Buskirk, MSW, LCSW-A  Date/Time: Initiated:  02/15/17/0852         Child's Name:  Lily-Ann Jane Whitt   Biological Parents:  Mother, Father (Delaynee Vig and Greg Whitt (DOB 05/24/85))   Need for Interpreter:  None   Reason for Referral:  Current Substance Use/Substance Use During Pregnancy    Address:  [address] [place] [address]    Phone number:  [phone] (home)     Additional phone number: [phone]  Household Members/Support Persons (HM/SP):   Household Member/Support Person 1   HM/SP Name Relationship DOB or Age  HM/SP -1 Greg Whitt  FOB 05/24/85  HM/SP -2     HM/SP -3     HM/SP -4     HM/SP -5     HM/SP -6     HM/SP -7     HM/SP -8       Natural Supports (not living in the home): Spouse/significant other, Friends   Professional Supports:None   Employment:Full-time   Type of Work: [employer]    Education:  High school graduate   Homebound arranged:    Financial Resources:Medicaid   Other Resources: WIC, Food Stamps    Cultural/Religious Considerations Which May Impact Care: None Reported   Strengths: Ability to meet basic needs , Home prepared for child , Compliance with medical plan    Psychotropic Medications:         Pediatrician:       Pediatrician List:   [place]   [place]   [place]   [place]   [place]   [place]     Pediatrician Fax Number:    Risk Factors/Current Problems: Substance Use    Cognitive State: Able to Concentrate , Alert , Insightful    Mood/Affect: Calm , Comfortable , Interested    CSW Assessment:CSW met with MOB at bedside to complete assessment for consult regarding drug exposed newborn. Upon this writers arrival, MOB  was in bed resting while FOB was on the couch watching TV and baby was asleep in basinet. This writer explained role and reasoning for visit. MOB was warm and welcoming noting I can proceed with FOB in the room. CSW inquired about hx of substance use. MOB notes she does not use substance but has smoked THC a few times; however, it is recreationally and does not affect her ability to perform in daily living activities. CSW thanked MOB for being fourth coming. This writer explained the hospitals policy and procedure regarding substance exposed newborns and mandated reporting. MOB understood. This writer informed MOB that UDS was unable to be collected on baby; however, CDS was collected but results are pending. CSW informed MOB that if baby's CDS results are positive, this writer will have to report to [place] DSS. MOB noted understanding and noted she is not worried about any substance showing up as she has not smoked in a while. MOB was unable to give exact date.    CSW assessed for any other psycho-social needs. MOB notes she has no further needs at this time. CSW thanked MOB for her time.   CSW Plan/Description: No Further Intervention Required/No Barriers to Discharge, CSW Will Continue to Monitor Umbilical Cord Tissue Drug Screen Results and Make Report if Warranted, Hospital Drug Screen Policy Information    Paraskevi Funez   Tarek Cravens, MSW, LCSW-A Clinical Social Worker  [facility]  Office: [phone]    

## 2017-02-15 NOTE — Lactation Note (Signed)
This note was copied from a baby's chart. Lactation Consultation Note  Patient Name: Kristine Oconnell XBJYN'WToday's Date: 02/15/2017 Reason for consult: Follow-up assessment   Follow up with mom of 54 hour old infant. Infant with 4 BF for 10-15 minutes, EBM x 6 of 2-9 cc, 4 voids and 2 stools in last 24 hours.   Mom reports it is time for infant to feed. Infant was awakened and soiled diaper changed. Infant was placed to left breast in the football hold, # 20 NS was applied infant took NS into mouth and did not suckle. Hand expressed 2 cc colostrum that was fed to infant. infatn did not respond and would not latch. Mom reports infant is sleepy. Discussed with mom that infant is not receiving enough calories and is out of energy.. Mom is pumping sporadically and hand expressing some. She reports she would prefer the manual pump although she has not used it to pump breasts. Discussed with mom that since infant is not latching well, and if mom is not willing to pump/ hand express to get infant food, then formula will need to be used for the safety of the infant.   Returned after mom pumped for 15 minutes with hands on pumping. Mom obtained 1 cc colostrum with hand expression post BF. Attempted again to latch infant to the breast and she would not suckle. Again discussed with mom that 3 cc colostrum is not enough calories for infant at 9854 hours old. Mom still does not want to give formula but did agree to use it as a bridge until her milk comes in. Supplementation amount handout given and reviewed. Dad was taught how to syringe feed infant. Infant finger fed well and took 18 cc. Discussed with parents increasing amounts to get up to at least 30-45 cc every 3 hours.   Discussed WIC loaner for home use, mom declined. Discussed with mom that DEBP recommended until infant feeding better. [facility]. Discussed with mom the importance of pumping and hand expressing to stimulate milk  production. Discussed with Dr. Manson PasseyBrown who is planning to go back and speak with parents.    Maternal Data Formula Feeding for Exclusion: No Has patient been taught Hand Expression?: Yes Does the patient have breastfeeding experience prior to this delivery?: No  Feeding Feeding Type: Breast Fed Length of feed: 0 min  LATCH Score Latch: Too sleepy or reluctant, no latch achieved, no sucking elicited.  Audible Swallowing: None  Type of Nipple: Everted at rest and after stimulation  Comfort (Breast/Nipple): Filling, red/small blisters or bruises, mild/mod discomfort  Hold (Positioning): Assistance needed to correctly position infant at breast and maintain latch.  LATCH Score: 4  Interventions Interventions: Breast feeding basics reviewed;Support pillows;Assisted with latch;Position options;Skin to skin;Expressed milk;Breast massage;Breast compression;Hand pump;DEBP  Lactation Tools Discussed/Used Tools: Nipple Shields Nipple shield size: 20 WIC Program: Yes Pump Review: Setup, frequency, and cleaning;Milk Storage Initiated by:: Reviewed and encouraged every 2-3 hours post BF   Consult Status Consult Status: Follow-up Date: 02/16/17 Follow-up type: In-patient    Kristine Oconnell 02/15/2017, 10:03 AM

## 2017-02-15 NOTE — Discharge Instructions (Signed)
Postpartum Hypertension °Postpartum hypertension is high blood pressure after pregnancy that remains higher than normal for more than two days after delivery. You may not realize that you have postpartum hypertension if your blood pressure is not being checked regularly. In some cases, postpartum hypertension will go away on its own, usually within a week of delivery. However, for some women, medical treatment is required to prevent serious complications, such as seizures or stroke. °The following things can affect your blood pressure: °· The type of delivery you had. °· Having received IV fluids or other medicines during or after delivery. ° °What are the causes? °Postpartum hypertension may be caused by any of the following or by a combination of any of the following: °· Hypertension that existed before pregnancy (chronic hypertension). °· Gestational hypertension. °· Preeclampsia or eclampsia. °· Receiving a lot of fluid through an IV during or after delivery. °· Medicines. °· HELLP syndrome. °· Hyperthyroidism. °· Stroke. °· Other rare neurological or blood disorders. ° °In some cases, the cause may not be known. °What increases the risk? °Postpartum hypertension can be related to one or more risk factors, such as: °· Chronic hypertension. In some cases, this may not have been diagnosed before pregnancy. °· Obesity. °· Type 2 diabetes. °· Kidney disease. °· Family history of preeclampsia. °· Other medical conditions that cause hormonal imbalances. ° °What are the signs or symptoms? °As with all types of hypertension, postpartum hypertension may not have any symptoms. Depending on how high your blood pressure is, you may experience: °· Headaches. These may be mild, moderate, or severe. They may also be steady, constant, or sudden in onset (thunderclap headache). °· Visual changes. °· Dizziness. °· Shortness of breath. °· Swelling of your hands, feet, lower legs, or face. In some cases, you may have swelling in  more than one of these locations. °· Heart palpitations or a racing heartbeat. °· Difficulty breathing while lying down. °· Decreased urination. ° °Other rare signs and symptoms may include: °· Sweating more than usual. This lasts longer than a few days after delivery. °· Chest pain. °· Sudden dizziness when you get up from sitting or lying down. °· Seizures. °· Nausea or vomiting. °· Abdominal pain. ° °How is this diagnosed? °The diagnosis of postpartum hypertension is made through a combination of physical examination findings and testing of your blood and urine. You may also have additional tests, such as a CT scan or an MRI, to check for other complications of postpartum hypertension. °How is this treated? °When blood pressure is high enough to require treatment, your options may include: °· Medicines to reduce blood pressure (antihypertensives). Tell your health care provider if you are breastfeeding or if you plan to breastfeed. There are many antihypertensive medicines that are safe to take while breastfeeding. °· Stopping medicines that may be causing hypertension. °· Treating medical conditions that are causing hypertension. °· Treating the complications of hypertension, such as seizures, stroke, or kidney problems. ° °Your health care provider will also continue to monitor your blood pressure closely and repeatedly until it is within a safe range for you. °Follow these instructions at home: °· Take medicines only as directed by your health care provider. °· Get regular exercise after your health care provider tells you that it is safe. °· Follow your health care provider’s recommendations on fluid and salt restrictions. °· Do not use any tobacco products, including cigarettes, chewing tobacco, or electronic cigarettes. If you need help quitting, ask your health care provider. °·   Keep all follow-up visits as directed by your health care provider. This is important. °Contact a health care provider  if: °· Your symptoms get worse. °· You have new symptoms, such as: °? Headache. °? Dizziness. °? Visual changes. °Get help right away if: °· You develop a severe or sudden headache. °· You have seizures. °· You develop numbness or weakness on one side of your body. °· You have difficulty thinking, speaking, or swallowing. °· You develop severe abdominal pain. °· You develop difficulty breathing, chest pain, a racing heartbeat, or heart palpitations. °These symptoms may represent a serious problem that is an emergency. Do not wait to see if the symptoms will go away. Get medical help right away. Call your local emergency services (911 in the [place] Do not drive yourself to the hospital. °This information is not intended to replace advice given to you by your health care provider. Make sure you discuss any questions you have with your health care provider. °Document Released: 12/09/2013 Document Revised: 09/10/2015 Document Reviewed: 10/20/2013 °Elsevier Interactive Patient Education © 2018 [employer] ° °

## 2017-02-16 ENCOUNTER — Ambulatory Visit: Payer: Self-pay

## 2017-02-16 NOTE — Lactation Note (Signed)
This note was copied from a baby's chart. Lactation Consultation Note  Patient Name: Kristine Oconnell ZOXWR'UToday's Date: 02/16/2017 Reason for consult: Follow-up assessment;Early term 137-38.6wks  Baby 7081 hours old. Mom has 60 ml of EBM at bedside and reports that baby is now latching and nursing well. Mom states that she is also having to give formula. Enc mom to keep putting baby to breast first, then supplement with EBM/formula, and then post-pump. Discussed engorgement prevention/treatment and mom aware of OP/BFSG and LC phone line assistance after D/C.   Maternal Data    Feeding Feeding Type: Breast Milk with Formula added Nipple Type: Slow - flow  LATCH Score                   Interventions    Lactation Tools Discussed/Used     Consult Status Consult Status: PRN    Sherlyn HayJennifer D Floyd Lusignan 02/16/2017, 12:32 PM

## 2017-02-20 ENCOUNTER — Ambulatory Visit: Payer: Medicaid Other | Admitting: Family Medicine

## 2017-03-11 ENCOUNTER — Ambulatory Visit: Payer: Medicaid Other | Admitting: Obstetrics & Gynecology

## 2018-01-29 ENCOUNTER — Ambulatory Visit (INDEPENDENT_AMBULATORY_CARE_PROVIDER_SITE_OTHER): Payer: Medicaid Other | Admitting: *Deleted

## 2018-01-29 ENCOUNTER — Encounter: Payer: Self-pay | Admitting: Family Medicine

## 2018-01-29 DIAGNOSIS — Z3201 Encounter for pregnancy test, result positive: Secondary | ICD-10-CM | POA: Diagnosis present

## 2018-01-29 LAB — POCT PREGNANCY, URINE: Preg Test, Ur: POSITIVE — AB

## 2018-01-29 NOTE — Progress Notes (Signed)
I have reviewed this chart and agree with the RN/CMA assessment and management.    K. Meryl Princeton Nabor, M.D. [facility]  

## 2018-01-29 NOTE — Progress Notes (Signed)
Pt here for pregnancy test.  Pregnancy test resulted positive.  Pt is unsure of her LMP.  "sometime in the summer either July or August"  This would put between [redacted]w[redacted]d and [redacted]w[redacted]d with EDD between 07/27/18 and 09/25/18.  Proof of pregnancy provided by front office.  Allergies verified and pt reports she is not currently taking any medications.  Recommended pt begin taking a prenatal multivitamin.  List of medications safe to take during pregnancy given. Spoke with Dr. Earlene Plater and verbal order given to order a dating ultrasound.  Wednesday 10/16 @ 0800 at [facility].  Recommended pt begin [facility].

## 2018-02-03 ENCOUNTER — Ambulatory Visit (HOSPITAL_COMMUNITY): Payer: Medicaid Other

## 2018-02-08 ENCOUNTER — Ambulatory Visit (INDEPENDENT_AMBULATORY_CARE_PROVIDER_SITE_OTHER): Payer: Self-pay | Admitting: *Deleted

## 2018-02-08 ENCOUNTER — Ambulatory Visit (HOSPITAL_COMMUNITY)
Admission: RE | Admit: 2018-02-08 | Discharge: 2018-02-08 | Disposition: A | Discharge status: Home / Self Care | Payer: Medicaid Other | Source: Ambulatory Visit | Attending: Obstetrics and Gynecology | Admitting: Obstetrics and Gynecology

## 2018-02-08 DIAGNOSIS — Z3400 Encounter for supervision of normal first pregnancy, unspecified trimester: Secondary | ICD-10-CM

## 2018-02-08 DIAGNOSIS — Z3A10 10 weeks gestation of pregnancy: Secondary | ICD-10-CM | POA: Insufficient documentation

## 2018-02-08 DIAGNOSIS — Z3201 Encounter for pregnancy test, result positive: Secondary | ICD-10-CM | POA: Insufficient documentation

## 2018-02-08 NOTE — Progress Notes (Signed)
I have reviewed the chart and agree with nursing staff's documentation of this patient's encounter.  Thressa Sheller, CNM 02/08/2018 1:46 PM

## 2018-02-08 NOTE — Progress Notes (Signed)
Here for Kristine Oconnell results. Reviewed with Thressa Sheller, CNM and notified patient Kristine Oconnell shows live baby [redacted]w[redacted]d. Also that we recommend she start prenatal care and otc Prenatal vitamins. She voices understanding.

## 2018-04-06 ENCOUNTER — Encounter: Payer: Self-pay | Admitting: General Practice

## 2018-04-12 ENCOUNTER — Other Ambulatory Visit (HOSPITAL_COMMUNITY)
Admission: RE | Admit: 2018-04-12 | Discharge: 2018-04-12 | Disposition: A | Discharge status: Home / Self Care | Payer: Medicaid Other | Source: Ambulatory Visit | Attending: Obstetrics and Gynecology | Admitting: Obstetrics and Gynecology

## 2018-04-12 ENCOUNTER — Other Ambulatory Visit: Payer: Self-pay

## 2018-04-12 ENCOUNTER — Ambulatory Visit: Payer: Medicaid Other | Admitting: *Deleted

## 2018-04-12 VITALS — BP 138/84 | HR 83 | Temp 98.0°F | Wt 205.4 lb

## 2018-04-12 DIAGNOSIS — Z3480 Encounter for supervision of other normal pregnancy, unspecified trimester: Secondary | ICD-10-CM | POA: Insufficient documentation

## 2018-04-12 HISTORY — DX: Encounter for supervision of other normal pregnancy, unspecified trimester: Z34.80

## 2018-04-12 LAB — POCT URINALYSIS DIPSTICK OB
BILIRUBIN UA: NEGATIVE
Glucose, UA: NEGATIVE
KETONES UA: NEGATIVE
Nitrite, UA: NEGATIVE
PH UA: 8.5 — AB (ref 5.0–8.0)
Spec Grav, UA: 1.015 (ref 1.010–1.025)
UROBILINOGEN UA: 0.2 U/dL

## 2018-04-12 NOTE — Patient Instructions (Addendum)
Second Trimester of Pregnancy  The second trimester is from week 14 through week 27 (month 4 through 6). This is often the time in pregnancy that you feel your best. Often times, morning sickness has lessened or quit. You may have more energy, and you may get hungry more often. Your unborn baby is growing rapidly. At the end of the sixth month, he or she is about 9 inches long and weighs about 1 pounds. You will likely feel the baby move between 18 and 20 weeks of pregnancy. Follow these instructions at home: Medicines  Take over-the-counter and prescription medicines only as told by your doctor. Some medicines are safe and some medicines are not safe during pregnancy.  Take a prenatal vitamin that contains at least 600 micrograms (mcg) of folic acid.  If you have trouble pooping (constipation), take medicine that will make your stool soft (stool softener) if your doctor approves. Eating and drinking   Eat regular, healthy meals.  Avoid raw meat and uncooked cheese.  If you get low calcium from the food you eat, talk to your doctor about taking a daily calcium supplement.  Avoid foods that are high in fat and sugars, such as fried and sweet foods.  If you feel sick to your stomach (nauseous) or throw up (vomit): ? Eat 4 or 5 small meals a day instead of 3 large meals. ? Try eating a few soda crackers. ? Drink liquids between meals instead of during meals.  To prevent constipation: ? Eat foods that are high in fiber, like fresh fruits and vegetables, whole grains, and beans. ? Drink enough fluids to keep your pee (urine) clear or pale yellow. Activity  Exercise only as told by your doctor. Stop exercising if you start to have cramps.  Do not exercise if it is too hot, too humid, or if you are in a place of great height (high altitude).  Avoid heavy lifting.  Wear low-heeled shoes. Sit and stand up straight.  You can continue to have sex unless your doctor tells you not  to. Relieving pain and discomfort  Wear a good support bra if your breasts are tender.  Take warm water baths (sitz baths) to soothe pain or discomfort caused by hemorrhoids. Use hemorrhoid cream if your doctor approves.  Rest with your legs raised if you have leg cramps or low back pain.  If you develop puffy, bulging veins (varicose veins) in your legs: ? Wear support hose or compression stockings as told by your doctor. ? Raise (elevate) your feet for 15 minutes, 3-4 times a day. ? Limit salt in your food. Prenatal care  Write down your questions. Take them to your prenatal visits.  Keep all your prenatal visits as told by your doctor. This is important. Safety  Wear your seat belt when driving.  Make a list of emergency phone numbers, including numbers for family, friends, the hospital, and police and fire departments. General instructions  Ask your doctor about the right foods to eat or for help finding a counselor, if you need these services.  Ask your doctor about local prenatal classes. Begin classes before month 6 of your pregnancy.  Do not use hot tubs, steam rooms, or saunas.  Do not douche or use tampons or scented sanitary pads.  Do not cross your legs for long periods of time.  Visit your dentist if you have not done so. Use a soft toothbrush to brush your teeth. Floss gently.  Avoid  all smoking, herbs, and alcohol. Avoid drugs that are not approved by your doctor.  Do not use any products that contain nicotine or tobacco, such as cigarettes and e-cigarettes. If you need help quitting, ask your doctor.  Avoid cat litter boxes and soil used by cats. These carry germs that can cause birth defects in the baby and can cause a loss of your baby (miscarriage) or stillbirth. Contact a doctor if:  You have mild cramps or pressure in your lower belly.  You have pain when you pee (urinate).  You have bad smelling fluid coming from your vagina.  You continue to  feel sick to your stomach (nauseous), throw up (vomit), or have watery poop (diarrhea).  You have a nagging pain in your belly area.  You feel dizzy. Get help right away if:  You have a fever.  You are leaking fluid from your vagina.  You have spotting or bleeding from your vagina.  You have severe belly cramping or pain.  You lose or gain weight rapidly.  You have trouble catching your breath and have chest pain.  You notice sudden or extreme puffiness (swelling) of your face, hands, ankles, feet, or legs.  You have not felt the baby move in over an hour.  You have severe headaches that do not go away when you take medicine.  You have trouble seeing. Summary  The second trimester is from week 14 through week 27 (months 4 through 6). This is often the time in pregnancy that you feel your best.  To take care of yourself and your unborn baby, you will need to eat healthy meals, take medicines only if your doctor tells you to do so, and do activities that are safe for you and your baby.  Call your doctor if you get sick or if you notice anything unusual about your pregnancy. Also, call your doctor if you need help with the right food to eat, or if you want to know what activities are safe for you. This information is not intended to replace advice given to you by your health care provider. Make sure you discuss any questions you have with your health care provider. Document Released: 07/02/2009 Document Revised: 05/13/2016 Document Reviewed: 05/13/2016 Elsevier Interactive Patient Education  2019 [employer]  Second Trimester of Pregnancy  The second trimester is from week 14 through week 27 (month 4 through 6). This is often the time in pregnancy that you feel your best. Often times, morning sickness has lessened or quit. You may have more energy, and you may get hungry more often. Your unborn baby is growing rapidly. At the end of the sixth month, he or she is about 9 inches  long and weighs about 1 pounds. You will likely feel the baby move between 18 and 20 weeks of pregnancy. Follow these instructions at home: Medicines  Take over-the-counter and prescription medicines only as told by your doctor. Some medicines are safe and some medicines are not safe during pregnancy.  Take a prenatal vitamin that contains at least 600 micrograms (mcg) of folic acid.  If you have trouble pooping (constipation), take medicine that will make your stool soft (stool softener) if your doctor approves. Eating and drinking   Eat regular, healthy meals.  Avoid raw meat and uncooked cheese.  If you get low calcium from the food you eat, talk to your doctor about taking a daily calcium supplement.  Avoid foods that are high in fat and sugars, such as  fried and sweet foods.  If you feel sick to your stomach (nauseous) or throw up (vomit): ? Eat 4 or 5 small meals a day instead of 3 large meals. ? Try eating a few soda crackers. ? Drink liquids between meals instead of during meals.  To prevent constipation: ? Eat foods that are high in fiber, like fresh fruits and vegetables, whole grains, and beans. ? Drink enough fluids to keep your pee (urine) clear or pale yellow. Activity  Exercise only as told by your doctor. Stop exercising if you start to have cramps.  Do not exercise if it is too hot, too humid, or if you are in a place of great height (high altitude).  Avoid heavy lifting.  Wear low-heeled shoes. Sit and stand up straight.  You can continue to have sex unless your doctor tells you not to. Relieving pain and discomfort  Wear a good support bra if your breasts are tender.  Take warm water baths (sitz baths) to soothe pain or discomfort caused by hemorrhoids. Use hemorrhoid cream if your doctor approves.  Rest with your legs raised if you have leg cramps or low back pain.  If you develop puffy, bulging veins (varicose veins) in your legs: ? Wear support  hose or compression stockings as told by your doctor. ? Raise (elevate) your feet for 15 minutes, 3-4 times a day. ? Limit salt in your food. Prenatal care  Write down your questions. Take them to your prenatal visits.  Keep all your prenatal visits as told by your doctor. This is important. Safety  Wear your seat belt when driving.  Make a list of emergency phone numbers, including numbers for family, friends, the hospital, and police and fire departments. General instructions  Ask your doctor about the right foods to eat or for help finding a counselor, if you need these services.  Ask your doctor about local prenatal classes. Begin classes before month 6 of your pregnancy.  Do not use hot tubs, steam rooms, or saunas.  Do not douche or use tampons or scented sanitary pads.  Do not cross your legs for long periods of time.  Visit your dentist if you have not done so. Use a soft toothbrush to brush your teeth. Floss gently.  Avoid all smoking, herbs, and alcohol. Avoid drugs that are not approved by your doctor.  Do not use any products that contain nicotine or tobacco, such as cigarettes and e-cigarettes. If you need help quitting, ask your doctor.  Avoid cat litter boxes and soil used by cats. These carry germs that can cause birth defects in the baby and can cause a loss of your baby (miscarriage) or stillbirth. Contact a doctor if:  You have mild cramps or pressure in your lower belly.  You have pain when you pee (urinate).  You have bad smelling fluid coming from your vagina.  You continue to feel sick to your stomach (nauseous), throw up (vomit), or have watery poop (diarrhea).  You have a nagging pain in your belly area.  You feel dizzy. Get help right away if:  You have a fever.  You are leaking fluid from your vagina.  You have spotting or bleeding from your vagina.  You have severe belly cramping or pain.  You lose or gain weight rapidly.  You have  trouble catching your breath and have chest pain.  You notice sudden or extreme puffiness (swelling) of your face, hands, ankles, feet, or legs.  You have not  felt the baby move in over an hour.  You have severe headaches that do not go away when you take medicine.  You have trouble seeing. Summary  The second trimester is from week 14 through week 27 (months 4 through 6). This is often the time in pregnancy that you feel your best.  To take care of yourself and your unborn baby, you will need to eat healthy meals, take medicines only if your doctor tells you to do so, and do activities that are safe for you and your baby.  Call your doctor if you get sick or if you notice anything unusual about your pregnancy. Also, call your doctor if you need help with the right food to eat, or if you want to know what activities are safe for you. This information is not intended to replace advice given to you by your health care provider. Make sure you discuss any questions you have with your health care provider. Document Released: 07/02/2009 Document Revised: 05/13/2016 Document Reviewed: 05/13/2016 Elsevier Interactive Patient Education  2019 [employer].  Warning Signs During Pregnancy A pregnancy lasts about 40 weeks, starting from the first day of your last period until the baby is born. Pregnancy is divided into three phases called trimesters.  The first trimester refers to week 1 through week 13 of pregnancy.  The second trimester is the start of week 14 through the end of week 27.  The third trimester is the start of week 28 until you deliver your baby. During each trimester of pregnancy, certain signs and symptoms may indicate a problem. Talk with your health care provider about your current health and any medical conditions you have. Make sure you know the symptoms that you should watch for and report. How does this affect me?  Warning signs in the first trimester While some changes  during the first trimester may be uncomfortable, most do not represent a serious problem. Let your health care provider know if you have any of the following warning signs in the first trimester:  You cannot eat or drink without vomiting, and this lasts for longer than a day.  You have vaginal bleeding or spotting along with menstrual-like cramping.  You have diarrhea for longer than a day.  You have a fever or other signs of infection, such as: ? Pain or burning when you urinate. ? Foul smelling or thick or yellowish vaginal discharge. Warning signs in the second trimester As your baby grows and changes during the second trimester, there are additional signs and symptoms that may indicate a problem. These include:  Signs and symptoms of infection, including a fever.  Signs or symptoms of a miscarriage or preterm labor, such as regular contractions, menstrual-like cramping, or lower abdominal pain.  Bloody or watery vaginal discharge or obvious vaginal bleeding.  Feeling like your heart is pounding.  Having trouble breathing.  Nausea, vomiting, or diarrhea that lasts for longer than a day.  Craving non-food items, such as clay, chalk, or dirt. This may be a sign of a very treatable medical condition called pica. Later in your second trimester, watch for signs and symptoms of a serious medical condition called preeclampsia.These include:  Changes in your vision.  A severe headache that does not go away.  Nausea and vomiting. It is also important to notice if your baby stops moving or moves less than usual during this time. Warning signs in the third trimester As you approach the third trimester, your baby is growing  and your body is preparing for the birth of your baby. In your third trimester, be sure to let your health care provider know if:  You have signs and symptoms of infection, including a fever.  You have vaginal bleeding.  You notice that your baby is moving less  than usual or is not moving.  You have nausea, vomiting, or diarrhea that lasts for longer than a day.  You have a severe headache that does not go away.  You have vision changes, including seeing spots or having blurry or double vision.  You have increased swelling in your hands or face. How does this affect my baby? Throughout your pregnancy, always report any of the warning signs of a problem to your health care provider. This can help prevent complications that may affect your baby, including:  Increased risk for premature birth.  Infection that may be transmitted to your baby.  Increased risk for stillbirth. Contact a health care provider if:  You have any of the warning signs of a problem for the current trimester of your pregnancy.  Any of the following apply to you during any trimester of pregnancy: ? You have strong emotions, such as sadness or anxiety, that interfere with work or personal relationships. ? You feel unsafe in your home and need help finding a safe place to live. ? You are using tobacco products, alcohol, or drugs and you need help to stop. Get help right away if: You have signs or symptoms of labor before 37 weeks of pregnancy. These include:  Contractions that are 5 minutes or less apart, or that increase in frequency, intensity, or length.  Sudden, sharp abdominal pain or low back pain.  Uncontrolled gush or trickle of fluid from your vagina. Summary  A pregnancy lasts about 40 weeks, starting from the first day of your last period until the baby is born. Pregnancy is divided into three phases called trimesters. Each trimester has warning signs to watch for.  Always report any warning signs to your health care provider in order to prevent complications that may affect both you and your baby.  Talk with your health care provider about your current health and any medical conditions you have. Make sure you know the symptoms that you should watch for and  report. This information is not intended to replace advice given to you by your health care provider. Make sure you discuss any questions you have with your health care provider. Document Released: 01/22/2017 Document Revised: 01/22/2017 Document Reviewed: 01/22/2017 Elsevier Interactive Patient Education  2019 [employer] Inc[employer]  Hypertension During Pregnancy  Hypertension is also called high blood pressure. High blood pressure means that the force of your blood moving in your body is too strong. When you are pregnant, this condition should be watched carefully. It can cause problems for you and your baby. Follow these instructions at home: Eating and drinking   Drink enough fluid to keep your pee (urine) pale yellow.  Avoid caffeine. Lifestyle  Do not use any products that contain nicotine or tobacco, such as cigarettes and e-cigarettes. If you need help quitting, ask your doctor.  Do not use alcohol or drugs.  Avoid stress.  Rest and get plenty of sleep. General instructions  Take over-the-counter and prescription medicines only as told by your doctor.  While lying down, lie on your left side. This keeps pressure off your major blood vessels.  While sitting or lying down, raise (elevate) your feet. Try putting some pillows under your  lower legs.  Exercise regularly. Ask your doctor what kinds of exercise are best for you.  Keep all prenatal and follow-up visits as told by your doctor. This is important. Contact a doctor if:  You have symptoms that your doctor told you to watch for, such as: ? Throwing up (vomiting). ? Feeling sick to your stomach (nausea). ? Headache. Get help right away if you have:  Very bad belly pain that does not get better with treatment.  A very bad headache that does not get better.  Throwing up that does not get better with treatment.  Sudden, fast weight gain.  Sudden swelling in your hands, ankles, or face.  Bleeding from your  vagina.  Blood in your pee.  Fewer movements from your baby than usual.  Blurry vision.  Double vision.  Muscle twitching.  Sudden muscle tightening (spasms).  Trouble breathing.  Blue fingernails or lips. Summary  Hypertension is also called high blood pressure. High blood pressure means that the force of your blood moving in your body is too strong.  When you are pregnant, this condition should be watched carefully. It can cause problems for you and your baby.  Get help right away if you have symptoms that your doctor told you to watch for. This information is not intended to replace advice given to you by your health care provider. Make sure you discuss any questions you have with your health care provider. Document Released: 05/10/2010 Document Revised: 03/24/2017 Document Reviewed: 12/18/2015 Elsevier Interactive Patient Education  2019 [employer]  Preeclampsia and Eclampsia  Preeclampsia is a serious condition that may develop during pregnancy. It is also called toxemia of pregnancy. This condition causes high blood pressure along with other symptoms, such as swelling and headaches. These symptoms may develop as the condition gets worse. Preeclampsia may occur at 20 weeks of pregnancy or later. Diagnosing and treating preeclampsia early is very important. If not treated early, it can cause serious problems for you and your baby. One problem it can lead to is eclampsia. Eclampsia is a condition that causes muscle jerking or shaking (convulsions or seizures) and other serious problems for the mother. During pregnancy, delivering your baby may be the best treatment for preeclampsia or eclampsia. For most women, preeclampsia and eclampsia symptoms go away after giving birth. In rare cases, a woman may develop preeclampsia after giving birth (postpartum preeclampsia). This usually occurs within 48 hours after childbirth but may occur up to 6 weeks after giving birth. What are  the causes? The cause of preeclampsia is not known. What increases the risk? The following risk factors make you more likely to develop preeclampsia:  Being pregnant for the first time.  Having had preeclampsia during a past pregnancy.  Having a family history of preeclampsia.  Having high blood pressure.  Being pregnant with more than one baby.  Being 88 or older.  Being African-American.  Having kidney disease or diabetes.  Having medical conditions such as lupus or blood diseases.  Being very overweight (obese). What are the signs or symptoms? The earliest signs of preeclampsia are:  High blood pressure.  Increased protein in your urine. Your health care provider will check for this at every visit before you give birth (prenatal visit). Other symptoms that may develop as the condition gets worse include:  Severe headaches.  Sudden weight gain.  Swelling of the hands, face, legs, and feet.  Nausea and vomiting.  Vision problems, such as blurred or double vision.  Numbness  in the face, arms, legs, and feet.  Urinating less than usual.  Dizziness.  Slurred speech.  Abdominal pain, especially upper abdominal pain.  Convulsions or seizures. How is this diagnosed? There are no screening tests for preeclampsia. Your health care provider will ask you about symptoms and check for signs of preeclampsia during your prenatal visits. You may also have tests that include:  Urine tests.  Blood tests.  Checking your blood pressure.  Monitoring your baby's heart rate.  Ultrasound. How is this treated? You and your health care provider will determine the treatment approach that is best for you. Treatment may include:  Having more frequent prenatal exams to check for signs of preeclampsia, if you have an increased risk for preeclampsia.  Medicine to lower your blood pressure.  Staying in the hospital, if your condition is severe. There, treatment will focus on  controlling your blood pressure and the amount of fluids in your body (fluid retention).  Taking medicine (magnesium sulfate) to prevent seizures. This may be given as an injection or through an IV.  Taking a low-dose aspirin during your pregnancy.  Delivering your baby early, if your condition gets worse. You may have your labor started with medicine (induced), or you may have a cesarean delivery. Follow these instructions at home: Eating and drinking   Drink enough fluid to keep your urine pale yellow.  Avoid caffeine. Lifestyle  Do not use any products that contain nicotine or tobacco, such as cigarettes and e-cigarettes. If you need help quitting, ask your health care provider.  Do not use alcohol or drugs.  Avoid stress as much as possible. Rest and get plenty of sleep. General instructions  Take over-the-counter and prescription medicines only as told by your health care provider.  When lying down, lie on your left side. This keeps pressure off your major blood vessels.  When sitting or lying down, raise (elevate) your feet. Try putting some pillows underneath your lower legs.  Exercise regularly. Ask your health care provider what kinds of exercise are best for you.  Keep all follow-up and prenatal visits as told by your health care provider. This is important. How is this prevented? There is no known way of preventing preeclampsia or eclampsia from developing. However, to lower your risk of complications and detect problems early:  Get regular prenatal care. Your health care provider may be able to diagnose and treat the condition early.  Maintain a healthy weight. Ask your health care provider for help managing weight gain during pregnancy.  Work with your health care provider to manage any long-term (chronic) health conditions you have, such as diabetes or kidney problems.  You may have tests of your blood pressure and kidney function after giving birth.  Your  health care provider may have you take low-dose aspirin during your next pregnancy. Contact a health care provider if:  You have symptoms that your health care provider told you may require more treatment or monitoring, such as: ? Headaches. ? Nausea or vomiting. ? Abdominal pain. ? Dizziness. ? Light-headedness. Get help right away if:  You have severe: ? Abdominal pain. ? Headaches that do not get better. ? Dizziness. ? Vision problems. ? Confusion. ? Nausea or vomiting.  You have any of the following: ? A seizure. ? Sudden, rapid weight gain. ? Sudden swelling in your hands, ankles, or face. ? Trouble moving any part of your body. ? Numbness in any part of your body. ? Trouble speaking. ? Abnormal bleeding.  You faint. Summary  Preeclampsia is a serious condition that may develop during pregnancy. It is also called toxemia of pregnancy.  This condition causes high blood pressure along with other symptoms, such as swelling and headaches.  Diagnosing and treating preeclampsia early is very important. If not treated early, it can cause serious problems for you and your baby.  Get help right away if you have symptoms that your health care provider told you to watch for. This information is not intended to replace advice given to you by your health care provider. Make sure you discuss any questions you have with your health care provider. Document Released: 04/04/2000 Document Revised: 03/24/2017 Document Reviewed: 11/12/2015 Elsevier Interactive Patient Education  2019 [employer].

## 2018-04-12 NOTE — Progress Notes (Signed)
   PRENATAL INTAKE SUMMARY  [name] presents today New OB Nurse Interview.  OB History    Gravida  4   Para  1   Term  1   Preterm      AB  2   Living  1     SAB  1   TAB  1   Ectopic      Multiple  0   Live Births  1          I have reviewed the patient's medical, obstetrical, social, and family histories, medications, and available lab results.  SUBJECTIVE She has no unusual complaints.  OBJECTIVE Initial nurse interview for history and lab work (New OB).  Late to care.  EDD: 08/31/2018 confirmed by ultrasound 02/08/18 GA: 309w6d [id number]  GENERAL APPEARANCE: alert, well appearing.   ASSESSMENT Normal pregnancy.  PLAN Prenatal care-[facility] Pnl/HIV  OB Urine Culture/dip GC/CT (urine) PAP complete 2018-Normal HgbEval-completed 2018-Negative SMA CF If CHTN - P/C Ratio and CMP-history of pre-eclampsia with last pregnancy Ultrasound MFM >14 week complete ordered Offered PNV. Pt stated she has about 200 tablets at home. Advised patient to start vitamins asap. Pt to set an alarm on cell phone to take PNV. Panorama- at next visit, due to insurance.  Kristine Oconnell, Kristine L, RN

## 2018-04-13 LAB — URINE CYTOLOGY ANCILLARY ONLY
Chlamydia: NEGATIVE
NEISSERIA GONORRHEA: NEGATIVE

## 2018-04-14 LAB — URINE CULTURE, OB REFLEX

## 2018-04-14 LAB — CULTURE, OB URINE

## 2018-04-20 ENCOUNTER — Ambulatory Visit (HOSPITAL_COMMUNITY)
Admission: RE | Admit: 2018-04-20 | Discharge: 2018-04-20 | Disposition: A | Discharge status: Home / Self Care | Payer: Medicaid Other | Source: Ambulatory Visit | Attending: Obstetrics and Gynecology | Admitting: Obstetrics and Gynecology

## 2018-04-20 ENCOUNTER — Other Ambulatory Visit: Payer: Self-pay | Admitting: Obstetrics and Gynecology

## 2018-04-20 DIAGNOSIS — Z3480 Encounter for supervision of other normal pregnancy, unspecified trimester: Secondary | ICD-10-CM

## 2018-04-20 DIAGNOSIS — O99212 Obesity complicating pregnancy, second trimester: Secondary | ICD-10-CM

## 2018-04-20 DIAGNOSIS — O09292 Supervision of pregnancy with other poor reproductive or obstetric history, second trimester: Secondary | ICD-10-CM

## 2018-04-20 DIAGNOSIS — Z3A21 21 weeks gestation of pregnancy: Secondary | ICD-10-CM | POA: Diagnosis not present

## 2018-04-20 DIAGNOSIS — Z363 Encounter for antenatal screening for malformations: Secondary | ICD-10-CM

## 2018-04-20 LAB — OBSTETRIC PANEL, INCLUDING HIV
Antibody Screen: NEGATIVE
BASOS: 0 %
Basophils Absolute: 0 10*3/uL (ref 0.0–0.2)
EOS (ABSOLUTE): 0.1 10*3/uL (ref 0.0–0.4)
EOS: 2 %
HEMATOCRIT: 33.3 % — AB (ref 34.0–46.6)
HEMOGLOBIN: 11.8 g/dL (ref 11.1–15.9)
HEP B S AG: NEGATIVE
HIV SCREEN 4TH GENERATION: NONREACTIVE
Immature Grans (Abs): 0.1 10*3/uL (ref 0.0–0.1)
Immature Granulocytes: 1 %
LYMPHS ABS: 1.8 10*3/uL (ref 0.7–3.1)
Lymphs: 26 %
MCH: 30.2 pg (ref 26.6–33.0)
MCHC: 35.4 g/dL (ref 31.5–35.7)
MCV: 85 fL (ref 79–97)
MONOCYTES: 6 %
Monocytes Absolute: 0.4 10*3/uL (ref 0.1–0.9)
NEUTROS ABS: 4.7 10*3/uL (ref 1.4–7.0)
Neutrophils: 65 %
Platelets: 174 10*3/uL (ref 150–450)
RBC: 3.91 x10E6/uL (ref 3.77–5.28)
RDW: 12.9 % (ref 12.3–15.4)
RH TYPE: POSITIVE
RPR: NONREACTIVE
RUBELLA: 11 {index} (ref >0.99)
WBC: 7.2 10*3/uL (ref 3.4–10.8)

## 2018-04-20 LAB — CYSTIC FIBROSIS MUTATION 97: GENE DIS ANAL CARRIER INTERP BLD/T-IMP: NOT DETECTED

## 2018-04-21 NOTE — L&D Delivery Note (Signed)
Delivery Note Pt progressed to complete at 0951 after being augmented with a little Pitocin. She pushed well and at 10:06 AM a viable female was delivered via Vaginal, Spontaneous (Presentation: OA- no restitution).  APGAR: 8, 9; weight 4095gm (9lb 0.5oz). Nuchal cord x 1 reduced after delivery of head but prior to delivery of body. No difficulty with delivery of shoulders. Infant dried and placed on pt's abd; cord clamped and cut by friend of pt. Hospital cord blood sample collected.  Placenta status: spont, intact.  Cord: 3 vessel  Anesthesia:  Epidural Episiotomy: None Lacerations: None Est. Blood Loss (mL): 50  Mom to postpartum.  Baby to Couplet care / Skin to Skin.  [name] CNM 08/27/2018, 10:39 AM  Please schedule this patient for Postpartum visit in: 4 weeks with the following provider: Any provider For C/S patients schedule nurse incision check in weeks 2 weeks: no Low risk pregnancy complicated by: none Delivery mode:  SVD Anticipated Birth Control:  other/unsure PP Procedures needed: none  Schedule Integrated BH visit: no

## 2018-04-22 LAB — SMN1 COPY NUMBER ANALYSIS (SMA CARRIER SCREENING)

## 2018-04-22 LAB — COMPREHENSIVE METABOLIC PANEL
A/G RATIO: 1.3 (ref 1.2–2.2)
ALK PHOS: 64 IU/L (ref 39–117)
ALT: 9 IU/L (ref 0–32)
AST: 10 IU/L (ref 0–40)
Albumin: 3.7 g/dL (ref 3.5–5.5)
BILIRUBIN TOTAL: 0.2 mg/dL (ref 0.0–1.2)
BUN/Creatinine Ratio: 7 — ABNORMAL LOW (ref 9–23)
BUN: 4 mg/dL — AB (ref 6–20)
CALCIUM: 8.5 mg/dL — AB (ref 8.7–10.2)
CO2: 19 mmol/L — ABNORMAL LOW (ref 20–29)
CREATININE: 0.54 mg/dL — AB (ref 0.57–1.00)
Chloride: 103 mmol/L (ref 96–106)
GFR calc Af Amer: 150 mL/min/{1.73_m2} (ref >59)
GFR calc non Af Amer: 130 mL/min/{1.73_m2} (ref >59)
GLOBULIN, TOTAL: 2.8 g/dL (ref 1.5–4.5)
Glucose: 79 mg/dL (ref 65–99)
Potassium: 3.8 mmol/L (ref 3.5–5.2)
Sodium: 137 mmol/L (ref 134–144)
TOTAL PROTEIN: 6.5 g/dL (ref 6.0–8.5)

## 2018-04-22 LAB — PROTEIN / CREATININE RATIO, URINE
Creatinine, Urine: 47.2 mg/dL
PROTEIN UR: 8.8 mg/dL
Protein/Creat Ratio: 186 mg/g creat (ref 0–200)

## 2018-04-29 ENCOUNTER — Ambulatory Visit (INDEPENDENT_AMBULATORY_CARE_PROVIDER_SITE_OTHER): Payer: Medicaid Other | Admitting: Obstetrics and Gynecology

## 2018-04-29 VITALS — BP 122/67 | HR 80 | Wt 210.0 lb

## 2018-04-29 DIAGNOSIS — Z3480 Encounter for supervision of other normal pregnancy, unspecified trimester: Secondary | ICD-10-CM

## 2018-04-29 DIAGNOSIS — Z3482 Encounter for supervision of other normal pregnancy, second trimester: Secondary | ICD-10-CM

## 2018-04-29 MED ORDER — ALBUTEROL SULFATE HFA 108 (90 BASE) MCG/ACT IN AERS: 2.0000 | INHALATION_SPRAY | Freq: Four times a day (QID) | RESPIRATORY_TRACT | 2 refills | Status: DC | PRN

## 2018-04-29 MED ORDER — DM-GUAIFENESIN ER 30-600 MG PO TB12: 1.0000 | ORAL_TABLET | Freq: Two times a day (BID) | ORAL | 0 refills | Status: DC

## 2018-04-29 NOTE — Progress Notes (Addendum)
  Subjective:    Kristine Oconnell is being seen today for her first obstetrical visit.  This is not a planned pregnancy. She is at [redacted]w[redacted]d gestation. Her obstetrical history is significant for pre-eclampsia. Relationship with FOB: significant other, not living together. Patient does intend to breast feed. Pregnancy history fully reviewed.  Patient reports no complaints.  Review of Systems:   Review of Systems  Constitutional: Negative.   HENT: Negative.   Eyes: Negative.   Cardiovascular: Negative.   Gastrointestinal: Negative.   Endocrine: Negative.   Genitourinary: Negative.   Musculoskeletal: Negative.   Skin: Negative.   Neurological: Negative.   Hematological: Negative.   Psychiatric/Behavioral: Negative.     Objective:     BP 122/67   Pulse 80   Wt 95.3 kg   BMI 31.01 kg/m  Physical Exam  Nursing note and vitals reviewed. Constitutional: She is oriented to person, place, and time. She appears well-developed and well-nourished. No distress.  HENT:  Head: Normocephalic.  Eyes: Pupils are equal, round, and reactive to light.  Neck: Normal range of motion.  Cardiovascular: Normal rate, regular rhythm and normal heart sounds.  No murmur heard. Respiratory: Effort normal and breath sounds normal. No respiratory distress.  GI: Soft. Bowel sounds are normal. She exhibits no distension. There is no abdominal tenderness.  Genitourinary:    Vulva, vagina and uterus normal.     No vaginal discharge.   Musculoskeletal: Normal range of motion.  Neurological: She is alert and oriented to person, place, and time.  Skin: Skin is warm and dry. She is not diaphoretic.  Psychiatric: She has a normal mood and affect. Her behavior is normal. Judgment and thought content normal.    Maternal Exam:  Abdomen: Patient reports no abdominal tenderness. Fundal height is 22cm.    Introitus: Normal vulva. Normal vagina.  Vagina is negative for discharge.  Pelvis: adequate for delivery.       Fetal Exam Fetal Monitor Review: Mode: hand-held doppler probe.   Baseline rate: 144.         Assessment:    Pregnancy: [id number] Patient Active Problem List   Diagnosis Date Noted  . Supervision of other normal pregnancy, antepartum 04/12/2018  . SVD (spontaneous vaginal delivery) 02/13/2017  . Gestational hypertension 02/11/2017  . Genital herpes affecting pregnancy, antepartum 02/02/2017  . Tobacco smoking affecting pregnancy, antepartum 08/13/2016       Plan:     Initial labs reviewed. Normal baseline PEC labs. Prenatal vitamins being taken. Problem list reviewed and updated. AFP3 discussed: undecided. Requests Panorama with 28wk labs to avoid additional blood draws. Role of ultrasound in pregnancy discussed; fetal survey: requested. Amniocentesis discussed: not indicated. The nature of [facility] with multiple MDs and other Advanced Practice Providers was explained to patient; also emphasized that residents, students are part of our team. Follow up in 4 weeks. 50% of 40 min visit spent on counseling and coordination of care.    Bernerd Limbo, SNM 04/29/2018

## 2018-04-29 NOTE — Patient Instructions (Signed)

## 2018-05-27 ENCOUNTER — Encounter: Payer: Medicaid Other | Admitting: Obstetrics and Gynecology

## 2018-05-27 ENCOUNTER — Encounter: Payer: Self-pay | Admitting: General Practice

## 2018-06-02 ENCOUNTER — Ambulatory Visit (INDEPENDENT_AMBULATORY_CARE_PROVIDER_SITE_OTHER): Payer: Medicaid Other | Admitting: Advanced Practice Midwife

## 2018-06-02 ENCOUNTER — Other Ambulatory Visit: Payer: Self-pay

## 2018-06-02 VITALS — BP 130/80 | HR 89 | Wt 214.2 lb

## 2018-06-02 DIAGNOSIS — O99332 Smoking (tobacco) complicating pregnancy, second trimester: Secondary | ICD-10-CM

## 2018-06-02 DIAGNOSIS — Z3480 Encounter for supervision of other normal pregnancy, unspecified trimester: Secondary | ICD-10-CM

## 2018-06-02 DIAGNOSIS — Z3A27 27 weeks gestation of pregnancy: Secondary | ICD-10-CM

## 2018-06-02 DIAGNOSIS — O99330 Smoking (tobacco) complicating pregnancy, unspecified trimester: Secondary | ICD-10-CM

## 2018-06-02 DIAGNOSIS — Z8759 Personal history of other complications of pregnancy, childbirth and the puerperium: Secondary | ICD-10-CM

## 2018-06-02 DIAGNOSIS — Z3482 Encounter for supervision of other normal pregnancy, second trimester: Secondary | ICD-10-CM

## 2018-06-02 NOTE — Patient Instructions (Signed)

## 2018-06-02 NOTE — Progress Notes (Signed)
   PRENATAL VISIT NOTE  Subjective:  Kristine Oconnell is a 28 y.o. G4P1021 at [redacted]w[redacted]d being seen today for ongoing prenatal care.  She is currently monitored for the following issues for this low-risk pregnancy and has Tobacco smoking affecting pregnancy, antepartum; Genital herpes affecting pregnancy, antepartum; Gestational hypertension; SVD (spontaneous vaginal delivery); and Supervision of other normal pregnancy, antepartum on their problem list.  Patient reports no complaints.  Contractions: Not present. Vag. Bleeding: None.  Movement: Present. Denies leaking of fluid.   The following portions of the patient's history were reviewed and updated as appropriate: allergies, current medications, past family history, past medical history, past social history, past surgical history and problem list. Problem list updated.  Objective:   Vitals:   06/02/18 1100  BP: 130/80  Pulse: 89  Weight: 214 lb 3.2 oz (97.2 kg)    Fetal Status: Fetal Heart Rate (bpm): 136   Movement: Present     General:  Alert, oriented and cooperative. Patient is in no acute distress.  Skin: Skin is warm and dry. No rash noted.   Cardiovascular: Normal heart rate noted  Respiratory: Normal respiratory effort, no problems with respiration noted  Abdomen: Soft, gravid, appropriate for gestational age.  Pain/Pressure: Absent     Pelvic: Cervical exam deferred        Extremities: Normal range of motion.  Edema: None  Mental Status: Normal mood and affect. Normal behavior. Normal judgment and thought content.   Assessment and Plan:  Pregnancy: G4P1021 at [redacted]w[redacted]d  1. Supervision of other normal pregnancy, antepartum - GTT at NV (pt not able to do it earlier).  - Refused Flu and TDaP vaccines. Info given  2. Tobacco smoking affecting pregnancy, antepartum   Preterm labor symptoms and general obstetric precautions including but not limited to vaginal bleeding, contractions, leaking of fluid and fetal movement were reviewed  in detail with the patient. Please refer to After Visit Summary for other counseling recommendations.  No follow-ups on file.  Future Appointments  Date Time Provider Department Center  06/17/2018  8:10 AM Raelyn Mora, CNM [facility] None  06/17/2018  8:50 AM [facility] None    Dorathy Kinsman, [place]

## 2018-06-17 ENCOUNTER — Encounter: Payer: Self-pay | Admitting: General Practice

## 2018-06-17 ENCOUNTER — Ambulatory Visit (INDEPENDENT_AMBULATORY_CARE_PROVIDER_SITE_OTHER): Payer: Medicaid Other | Admitting: Obstetrics and Gynecology

## 2018-06-17 ENCOUNTER — Other Ambulatory Visit: Payer: Self-pay

## 2018-06-17 ENCOUNTER — Other Ambulatory Visit: Payer: Medicaid Other | Admitting: *Deleted

## 2018-06-17 VITALS — BP 136/89 | HR 86 | Wt 214.0 lb

## 2018-06-17 DIAGNOSIS — Z3480 Encounter for supervision of other normal pregnancy, unspecified trimester: Secondary | ICD-10-CM

## 2018-06-17 DIAGNOSIS — Z3A29 29 weeks gestation of pregnancy: Secondary | ICD-10-CM

## 2018-06-17 DIAGNOSIS — Z3483 Encounter for supervision of other normal pregnancy, third trimester: Secondary | ICD-10-CM | POA: Diagnosis not present

## 2018-06-17 DIAGNOSIS — Z8759 Personal history of other complications of pregnancy, childbirth and the puerperium: Secondary | ICD-10-CM

## 2018-06-17 NOTE — Progress Notes (Signed)
   Patient in clinic for 28 week lab work. Declined Tdap.  Clovis Pu, RN

## 2018-06-17 NOTE — Progress Notes (Signed)
   PRENATAL VISIT NOTE  Subjective:  Kristine Oconnell is a 28 y.o. G4P1021 at [redacted]w[redacted]d being seen today for ongoing prenatal care.  She is currently monitored for the following issues for this low-risk pregnancy and has Tobacco smoking affecting pregnancy, antepartum; Genital herpes affecting pregnancy, antepartum; History of gestational hypertension; and Supervision of other normal pregnancy, antepartum on their problem list.  Patient reports no physical complaints, is under a lot of stress. She is in the middle of a move and the only adult working in her household. She denies HA, blurred vision, dizziness unrelated to hunger, and epigastric pain/discomfort.  Contractions: Not present. Vag. Bleeding: None.  Movement: Present. Denies leaking of fluid.   The following portions of the patient's history were reviewed and updated as appropriate: allergies, current medications, past family history, past medical history, past social history, past surgical history and problem list. Problem list updated.  Objective:   Vitals:   06/17/18 0805  BP: 136/89  Pulse: 86  Weight: 97.1 kg    Fetal Status: Fetal Heart Rate (bpm): 143 Fundal Height: 28 cm Movement: Present     General:  Alert, oriented and cooperative. Patient is in no acute distress.  Skin: Skin is warm and dry. No rash noted.   Cardiovascular: Normal heart rate noted  Respiratory: Normal respiratory effort, no problems with respiration noted  Abdomen: Soft, gravid, appropriate for gestational age.  Pain/Pressure: Absent     Pelvic: Cervical exam deferred        Extremities: Normal range of motion.  Edema: None  Mental Status: Normal mood and affect. Normal behavior. Normal judgment and thought content.   Assessment and Plan:  Pregnancy: G4P1021 at [redacted]w[redacted]d  1. Supervision of other normal pregnancy, antepartum - 2hr GTT today - HIV Antibody (routine testing w rflx) - RPR - CBC - Protein / creatinine ratio, urine - Comprehensive metabolic  panel  2. History of gestational hypertension - Discussed importance of prioritizing her own nutrition and overall well-being since her hypertension is often tied to stress - Anticipatory guidance given about pre-eclampsia management - Protein / creatinine ratio, urine - Comprehensive metabolic panel  Preterm labor symptoms and general obstetric precautions including but not limited to vaginal bleeding, contractions, leaking of fluid and fetal movement were reviewed in detail with the patient. Please refer to After Visit Summary for other counseling recommendations.  Return in about 2 weeks (around 07/01/2018) for ROB.  Future Appointments  Date Time Provider Department Center  06/17/2018  8:50 AM Clovis Pu, RN [facility] None    Bernerd Limbo, [facility]

## 2018-06-18 LAB — CBC
Hematocrit: 33.2 % — ABNORMAL LOW (ref 34.0–46.6)
Hemoglobin: 11.5 g/dL (ref 11.1–15.9)
MCH: 30.2 pg (ref 26.6–33.0)
MCHC: 34.6 g/dL (ref 31.5–35.7)
MCV: 87 fL (ref 79–97)
Platelets: 166 10*3/uL (ref 150–450)
RBC: 3.81 x10E6/uL (ref 3.77–5.28)
RDW: 12.5 % (ref 11.7–15.4)
WBC: 7.4 10*3/uL (ref 3.4–10.8)

## 2018-06-18 LAB — COMPREHENSIVE METABOLIC PANEL
ALT: 8 IU/L (ref 0–32)
AST: 10 IU/L (ref 0–40)
Albumin/Globulin Ratio: 1.4 (ref 1.2–2.2)
Albumin: 3.7 g/dL — ABNORMAL LOW (ref 3.9–5.0)
Alkaline Phosphatase: 77 IU/L (ref 39–117)
BUN/Creatinine Ratio: 9 (ref 9–23)
BUN: 5 mg/dL — ABNORMAL LOW (ref 6–20)
Bilirubin Total: <0.2 mg/dL (ref 0.0–1.2)
CO2: 21 mmol/L (ref 20–29)
Calcium: 8.5 mg/dL — ABNORMAL LOW (ref 8.7–10.2)
Chloride: 103 mmol/L (ref 96–106)
Creatinine, Ser: 0.58 mg/dL (ref 0.57–1.00)
GFR calc Af Amer: 146 mL/min/{1.73_m2} (ref >59)
GFR calc non Af Amer: 127 mL/min/{1.73_m2} (ref >59)
Globulin, Total: 2.6 g/dL (ref 1.5–4.5)
Glucose: 77 mg/dL (ref 65–99)
Potassium: 4.3 mmol/L (ref 3.5–5.2)
Sodium: 138 mmol/L (ref 134–144)
Total Protein: 6.3 g/dL (ref 6.0–8.5)

## 2018-06-18 LAB — RPR: RPR Ser Ql: NONREACTIVE

## 2018-06-18 LAB — PROTEIN / CREATININE RATIO, URINE
Creatinine, Urine: 124.7 mg/dL
Protein, Ur: 13.5 mg/dL
Protein/Creat Ratio: 108 mg/g creat (ref 0–200)

## 2018-06-18 LAB — GLUCOSE TOLERANCE, 2 HOURS W/ 1HR
GLUCOSE, 1 HOUR: 128 mg/dL (ref 65–179)
GLUCOSE, 2 HOUR: 88 mg/dL (ref 65–152)
Glucose, Fasting: 76 mg/dL (ref 65–91)

## 2018-06-18 LAB — HIV ANTIBODY (ROUTINE TESTING W REFLEX): HIV SCREEN 4TH GENERATION: NONREACTIVE

## 2018-06-20 LAB — CULTURE, OB URINE

## 2018-06-20 LAB — URINE CULTURE, OB REFLEX

## 2018-07-01 ENCOUNTER — Other Ambulatory Visit: Payer: Self-pay

## 2018-07-01 ENCOUNTER — Ambulatory Visit (INDEPENDENT_AMBULATORY_CARE_PROVIDER_SITE_OTHER): Payer: Medicaid Other | Admitting: Certified Nurse Midwife

## 2018-07-01 ENCOUNTER — Encounter: Payer: Self-pay | Admitting: Certified Nurse Midwife

## 2018-07-01 VITALS — BP 133/88 | HR 103 | Temp 98.4°F | Wt 217.2 lb

## 2018-07-01 DIAGNOSIS — Z3A31 31 weeks gestation of pregnancy: Secondary | ICD-10-CM

## 2018-07-01 DIAGNOSIS — Z3483 Encounter for supervision of other normal pregnancy, third trimester: Secondary | ICD-10-CM

## 2018-07-01 NOTE — Progress Notes (Signed)
   PRENATAL VISIT NOTE  Subjective:  Kristine Oconnell is a 28 y.o. G4P1021 at [redacted]w[redacted]d being seen today for ongoing prenatal care.  She is currently monitored for the following issues for this low-risk pregnancy and has Tobacco smoking affecting pregnancy, antepartum; Genital herpes affecting pregnancy, antepartum; History of gestational hypertension; and Supervision of other normal pregnancy, antepartum on their problem list.  Patient reports muscle irritation to the right of her sternum. States she woke up feeling like she was having a tiny muscle cramp in that area and then it was tingling. It has subsided to just sore/tenderness.  Contractions: Not present. Vag. Bleeding: None.  Movement: Present. Denies leaking of fluid.   The following portions of the patient's history were reviewed and updated as appropriate: allergies, current medications, past family history, past medical history, past social history, past surgical history and problem list. Problem list updated.  Objective:   Vitals:   07/01/18 1623  BP: 133/88  Pulse: (!) 103  Temp: 98.4 F (36.9 C)  Weight: 98.5 kg    Fetal Status: Fetal Heart Rate (bpm): 144 Fundal Height: 31 cm Movement: Present     General:  Alert, oriented and cooperative. Patient is in no acute distress.  Skin: Skin is warm and dry. No rash noted.   Cardiovascular: Normal heart rate noted  Respiratory: Normal respiratory effort, no problems with respiration noted  Abdomen: Soft, gravid, appropriate for gestational age.  Pain/Pressure: Absent    Area just to the right of her sternum tender to the touch but not reddened or edematous  Pelvic: Cervical exam deferred        Extremities: Normal range of motion.  Edema: None  Mental Status: Normal mood and affect. Normal behavior. Normal judgment and thought content.   Assessment and Plan:  Pregnancy: G4P1021 at [redacted]w[redacted]d  1. Encounter for supervision of other normal pregnancy in third trimester - Advised use of a  heating pad and demonstrated stretches that will help ease the muscle pain  Preterm labor symptoms and general obstetric precautions including but not limited to vaginal bleeding, contractions, leaking of fluid and fetal movement were reviewed in detail with the patient. Please refer to After Visit Summary for other counseling recommendations.  Return in about 2 weeks (around 07/15/2018) for ROB.  Future Appointments  Date Time Provider Department Center  07/15/2018  3:10 PM Raelyn Mora, CNM [facility] None  07/29/2018  3:10 PM Raelyn Mora, CNM [facility] None  08/04/2018  8:10 AM Raelyn Mora, CNM [facility] None  08/12/2018  3:10 PM Raelyn Mora, CNM [facility] None  08/19/2018  3:10 PM Raelyn Mora, CNM [facility] None    Bernerd Limbo, Student-Mid[facility]ife

## 2018-07-02 ENCOUNTER — Encounter: Payer: Medicaid Other | Admitting: Student

## 2018-07-02 ENCOUNTER — Other Ambulatory Visit: Payer: Medicaid Other

## 2018-07-12 ENCOUNTER — Telehealth: Payer: Self-pay | Admitting: General Practice

## 2018-07-12 NOTE — Telephone Encounter (Signed)
Left message on VM in regards to restrictions d/t COVID-19.  Asked patient to give our office a call with any questions or concerns.

## 2018-07-14 ENCOUNTER — Telehealth: Payer: Self-pay | Admitting: General Practice

## 2018-07-14 NOTE — Telephone Encounter (Signed)
Informed pt of Telehealth visit for tomorrow, 07/15/2018 at 3:10pm.  Pt stated that her Aunt has a bp cuff that she could use. Pt verbalized understanding.

## 2018-07-15 ENCOUNTER — Telehealth: Payer: Self-pay | Admitting: *Deleted

## 2018-07-15 ENCOUNTER — Encounter: Payer: Medicaid Other | Admitting: Obstetrics and Gynecology

## 2018-07-15 NOTE — Telephone Encounter (Signed)
Left voice message for patient to call clinic regarding telehealth appointment.  Clovis Pu, RN

## 2018-07-15 NOTE — Telephone Encounter (Signed)
Attempted to call pt regarding telehealth appointment, but no answer.

## 2018-07-26 ENCOUNTER — Telehealth: Payer: Self-pay | Admitting: General Practice

## 2018-07-26 NOTE — Telephone Encounter (Signed)
Left message on VM informing pt that she will need to physically come to her appt on 08/04/2018.  Asked pt to give our office a call with any questions or concerns.

## 2018-07-29 ENCOUNTER — Encounter: Payer: Medicaid Other | Admitting: Obstetrics and Gynecology

## 2018-08-04 ENCOUNTER — Encounter: Payer: Medicaid Other | Admitting: Obstetrics and Gynecology

## 2018-08-12 ENCOUNTER — Other Ambulatory Visit: Payer: Self-pay

## 2018-08-12 ENCOUNTER — Other Ambulatory Visit (HOSPITAL_COMMUNITY)
Admission: RE | Admit: 2018-08-12 | Discharge: 2018-08-12 | Disposition: A | Discharge status: Home / Self Care | Payer: Medicaid Other | Source: Ambulatory Visit | Attending: Obstetrics and Gynecology | Admitting: Obstetrics and Gynecology

## 2018-08-12 ENCOUNTER — Ambulatory Visit (INDEPENDENT_AMBULATORY_CARE_PROVIDER_SITE_OTHER): Payer: Medicaid Other | Admitting: Obstetrics and Gynecology

## 2018-08-12 VITALS — BP 127/85 | HR 94 | Temp 98.4°F | Wt 226.0 lb

## 2018-08-12 DIAGNOSIS — Z3480 Encounter for supervision of other normal pregnancy, unspecified trimester: Secondary | ICD-10-CM | POA: Diagnosis not present

## 2018-08-12 DIAGNOSIS — Z3483 Encounter for supervision of other normal pregnancy, third trimester: Secondary | ICD-10-CM

## 2018-08-12 DIAGNOSIS — Z3A37 37 weeks gestation of pregnancy: Secondary | ICD-10-CM

## 2018-08-13 ENCOUNTER — Encounter: Payer: Self-pay | Admitting: Obstetrics and Gynecology

## 2018-08-13 LAB — CERVICOVAGINAL ANCILLARY ONLY
Chlamydia: NEGATIVE
Neisseria Gonorrhea: NEGATIVE

## 2018-08-13 NOTE — Progress Notes (Signed)
   PRENATAL VISIT NOTE  Subjective:  Kristine Oconnell is a 28 y.o. G4P1021 at [redacted]w[redacted]d being seen today for ongoing prenatal care.  She is currently monitored for the following issues for this low-risk pregnancy and has Tobacco smoking affecting pregnancy, antepartum; Genital herpes affecting pregnancy, antepartum; History of gestational hypertension; and Supervision of other normal pregnancy, antepartum on their problem list.  Patient reports no complaints. She wants to have a home birth with assistance from her cousin ("who has no professional training in childbirth, but has had a baby before."). She would like to have a pool to deliver in, but not fill it with water. "It will make it easier to clean up afterwards." She states her desire to have a home birth stems from the experience she had on MBU with her last delivery. She reports that she declined vaccines, "so they wouldn't let me take the baby home for 7-10 days when there was absolutely nothing wrong with either one of [place]." She has not selected a pediatrician, but plans to take this child to the same pediatrician where her daughter goes. She also expresses concerns about delivering in the hospital with the COVID-19 pandemic.  Contractions: Not present. Vag. Bleeding: None.  Movement: Present. Denies leaking of fluid.   The following portions of the patient's history were reviewed and updated as appropriate: allergies, current medications, past family history, past medical history, past social history, past surgical history and problem list.   Objective:   Vitals:   08/12/18 1513  BP: 127/85  Pulse: 94  Temp: 98.4 F (36.9 C)  Weight: 226 lb (102.5 kg)    Fetal Status: Fetal Heart Rate (bpm): 150 Fundal Height: 38 cm Movement: Present  Presentation: Vertex  General:  Alert, oriented and cooperative. Patient is in no acute distress.  Skin: Skin is warm and dry. No rash noted.   Cardiovascular: Normal heart rate noted  Respiratory: Normal  respiratory effort, no problems with respiration noted  Abdomen: Soft, gravid, appropriate for gestational age.  Pain/Pressure: Absent     Pelvic: Cervical exam performed Dilation: 1 Effacement (%): 50 Station: -3  Extremities: Normal range of motion.  Edema: None  Mental Status: Normal mood and affect. Normal behavior. Normal judgment and thought content.   Assessment and Plan:  Pregnancy: G4P1021 at [redacted]w[redacted]d 1. Supervision of other normal pregnancy, antepartum - Cervicovaginal ancillary only( Kristine Oconnell) - Culture, beta strep (group b only) - Discussed with patient that having a home birth would NOT be safe for her the way she has planned it. Informed her of all the possible dangers that could arise during and after childbirth. She would have needed to contact a CNM that does home births far in advance to get that set up.  Term labor symptoms and general obstetric precautions including but not limited to vaginal bleeding, contractions, leaking of fluid and fetal movement were reviewed in detail with the patient. Please refer to After Visit Summary for other counseling recommendations.   Return in about 1 week (around 08/19/2018) for Return OB - webex/telehealth.  Future Appointments  Date Time Provider Department Center  08/19/2018  3:10 PM Raelyn Mora, CNM [facility] None  08/26/2018  3:40 PM Raelyn Mora, CNM [facility] None    Raelyn Mora, CNM

## 2018-08-16 LAB — CULTURE, BETA STREP (GROUP B ONLY): Strep Gp B Culture: NEGATIVE

## 2018-08-19 ENCOUNTER — Ambulatory Visit (INDEPENDENT_AMBULATORY_CARE_PROVIDER_SITE_OTHER): Payer: Medicaid Other | Admitting: Obstetrics and Gynecology

## 2018-08-19 ENCOUNTER — Other Ambulatory Visit: Payer: Self-pay

## 2018-08-19 VITALS — Wt 227.0 lb

## 2018-08-19 DIAGNOSIS — Z8759 Personal history of other complications of pregnancy, childbirth and the puerperium: Secondary | ICD-10-CM | POA: Diagnosis not present

## 2018-08-19 DIAGNOSIS — Z3A38 38 weeks gestation of pregnancy: Secondary | ICD-10-CM

## 2018-08-19 DIAGNOSIS — O98313 Other infections with a predominantly sexual mode of transmission complicating pregnancy, third trimester: Secondary | ICD-10-CM

## 2018-08-19 DIAGNOSIS — A6009 Herpesviral infection of other urogenital tract: Secondary | ICD-10-CM | POA: Diagnosis not present

## 2018-08-19 DIAGNOSIS — Z3483 Encounter for supervision of other normal pregnancy, third trimester: Secondary | ICD-10-CM | POA: Diagnosis not present

## 2018-08-19 DIAGNOSIS — O98319 Other infections with a predominantly sexual mode of transmission complicating pregnancy, unspecified trimester: Secondary | ICD-10-CM

## 2018-08-19 NOTE — Progress Notes (Signed)
   TELEHEALTH VIRTUAL OBSTETRICS VISIT ENCOUNTER NOTE  I connected with Kristine Oconnell on 08/20/18 at  3:00 PM EDT by telephone at home and verified that I am speaking with the correct person using two identifiers.   I discussed the limitations, risks, security and privacy concerns of performing an evaluation and management service by telephone and the availability of in person appointments. I also discussed with the patient that there may be a patient responsible charge related to this service. The patient expressed understanding and agreed to proceed.  Subjective:  Kristine Oconnell is a 28 y.o. G4P1021 at [redacted]w[redacted]d being followed for ongoing prenatal care.  She is currently monitored for the following issues for this low-risk pregnancy and has Tobacco smoking affecting pregnancy, antepartum; Genital herpes affecting pregnancy, antepartum; History of gestational hypertension; and Supervision of other normal pregnancy, antepartum on their problem list.  Patient reports occasional contractions and pelvic pressure. She is not taking the Valtrex that was Rx'd at her last visit. She states, "I didn't take it last time and everything was fine. I am not going to take it this time either." Reports fetal movement. Denies any contractions, bleeding or leaking of fluid.   The following portions of the patient's history were reviewed and updated as appropriate: allergies, current medications, past family history, past medical history, past social history, past surgical history and problem list.   Objective:   General:  Alert, oriented and cooperative.   Mental Status: Normal mood and affect perceived. Normal judgment and thought content.  Rest of physical exam deferred due to type of encounter  Assessment and Plan:  Pregnancy: [id number] at [redacted]w[redacted]d Encounter for supervision of other normal pregnancy in third trimester - Labor precautions discussed  History of gestational hypertension - Not taking bASA - Advised  that next appt will need to be in the office, if she is not going to have BP cuff. Explained we cannot go that long without a BP check especially with her h/o PEC in last pregnancy.  Genital herpes affecting pregnancy, antepartum - Discussed the importance of taking Valtrex daily to reduce the chances of an outbreak near or at delivery - Advised that if there is a lesion found or suspected it will be recommended she have a C/S. Patient verbalized an understanding of the risks of not taking Valtrex suppression therapy.   Term labor symptoms and general obstetric precautions including but not limited to vaginal bleeding, contractions, leaking of fluid and fetal movement were reviewed in detail with the patient.  I discussed the assessment and treatment plan with the patient. The patient was provided an opportunity to ask questions and all were answered. The patient agreed with the plan and demonstrated an understanding of the instructions. The patient was advised to call back or seek an in-person office evaluation/go to [facility] at [facility] for any urgent or concerning symptoms. Please refer to After Visit Summary for other counseling recommendations.   I provided 10 minutes of non-face-to-face time during this encounter. There was 5 minutes of chart review time spent prior to this encounter. Total time spent = 15 minutes.   Return in about 1 week (around 08/26/2018) for Return OB visit.  Future Appointments  Date Time Provider Department Center  08/26/2018  3:40 PM Raelyn Mora, CNM [facility] None    Raelyn Mora, CNM [facility] for [employer], [facility]

## 2018-08-20 ENCOUNTER — Encounter: Payer: Self-pay | Admitting: Obstetrics and Gynecology

## 2018-08-26 ENCOUNTER — Ambulatory Visit (INDEPENDENT_AMBULATORY_CARE_PROVIDER_SITE_OTHER): Payer: Medicaid Other | Admitting: Obstetrics and Gynecology

## 2018-08-26 ENCOUNTER — Encounter (HOSPITAL_COMMUNITY): Payer: Self-pay | Admitting: *Deleted

## 2018-08-26 ENCOUNTER — Encounter: Payer: Self-pay | Admitting: Obstetrics and Gynecology

## 2018-08-26 ENCOUNTER — Inpatient Hospital Stay (HOSPITAL_COMMUNITY): Payer: Medicaid Other | Admitting: Anesthesiology

## 2018-08-26 ENCOUNTER — Inpatient Hospital Stay (HOSPITAL_COMMUNITY)
Admission: AD | Admit: 2018-08-26 | Discharge: 2018-08-28 | DRG: 805 | Disposition: A | Discharge status: Home / Self Care | Payer: Medicaid Other | Attending: Obstetrics and Gynecology | Admitting: Obstetrics and Gynecology

## 2018-08-26 ENCOUNTER — Other Ambulatory Visit: Payer: Self-pay

## 2018-08-26 VITALS — BP 133/81 | HR 86 | Temp 98.1°F | Wt 229.2 lb

## 2018-08-26 DIAGNOSIS — Z3483 Encounter for supervision of other normal pregnancy, third trimester: Secondary | ICD-10-CM

## 2018-08-26 DIAGNOSIS — Z3A39 39 weeks gestation of pregnancy: Secondary | ICD-10-CM | POA: Diagnosis not present

## 2018-08-26 DIAGNOSIS — Z8759 Personal history of other complications of pregnancy, childbirth and the puerperium: Secondary | ICD-10-CM

## 2018-08-26 DIAGNOSIS — Z87891 Personal history of nicotine dependence: Secondary | ICD-10-CM | POA: Diagnosis not present

## 2018-08-26 DIAGNOSIS — O165 Unspecified maternal hypertension, complicating the puerperium: Secondary | ICD-10-CM | POA: Diagnosis not present

## 2018-08-26 DIAGNOSIS — Z3480 Encounter for supervision of other normal pregnancy, unspecified trimester: Secondary | ICD-10-CM

## 2018-08-26 DIAGNOSIS — O135 Gestational [pregnancy-induced] hypertension without significant proteinuria, complicating the puerperium: Secondary | ICD-10-CM | POA: Diagnosis not present

## 2018-08-26 DIAGNOSIS — O479 False labor, unspecified: Secondary | ICD-10-CM

## 2018-08-26 DIAGNOSIS — A6009 Herpesviral infection of other urogenital tract: Secondary | ICD-10-CM

## 2018-08-26 DIAGNOSIS — I10 Essential (primary) hypertension: Secondary | ICD-10-CM | POA: Diagnosis not present

## 2018-08-26 DIAGNOSIS — O411290 Chorioamnionitis, unspecified trimester, not applicable or unspecified: Secondary | ICD-10-CM | POA: Diagnosis not present

## 2018-08-26 DIAGNOSIS — O26893 Other specified pregnancy related conditions, third trimester: Secondary | ICD-10-CM | POA: Diagnosis present

## 2018-08-26 DIAGNOSIS — O411230 Chorioamnionitis, third trimester, not applicable or unspecified: Secondary | ICD-10-CM | POA: Diagnosis present

## 2018-08-26 DIAGNOSIS — O98319 Other infections with a predominantly sexual mode of transmission complicating pregnancy, unspecified trimester: Secondary | ICD-10-CM

## 2018-08-26 HISTORY — DX: False labor, unspecified: O47.9

## 2018-08-26 LAB — CBC
HCT: 36.7 % (ref 36.0–46.0)
Hemoglobin: 12.2 g/dL (ref 12.0–15.0)
MCH: 29 pg (ref 26.0–34.0)
MCHC: 33.2 g/dL (ref 30.0–36.0)
MCV: 87.4 fL (ref 80.0–100.0)
Platelets: 174 10*3/uL (ref 150–400)
RBC: 4.2 MIL/uL (ref 3.87–5.11)
RDW: 13.8 % (ref 11.5–15.5)
WBC: 9.8 10*3/uL (ref 4.0–10.5)
nRBC: 0 % (ref 0.0–0.2)

## 2018-08-26 LAB — TYPE AND SCREEN
ABO/RH(D): O POS
Antibody Screen: NEGATIVE

## 2018-08-26 LAB — ABO/RH: ABO/RH(D): O POS

## 2018-08-26 MED ORDER — DIPHENHYDRAMINE HCL 50 MG/ML IJ SOLN: 12.5000 mg | INTRAMUSCULAR | Status: DC | PRN

## 2018-08-26 MED ORDER — GENTAMICIN SULFATE 40 MG/ML IJ SOLN
220.0000 mg | Freq: Three times a day (TID) | INTRAVENOUS | Status: DC
  Administered 2018-08-27 (×2): 220 mg via INTRAVENOUS
  Filled 2018-08-26 (×3): qty 5.5

## 2018-08-26 MED ORDER — ACETAMINOPHEN 325 MG PO TABS: 650.0000 mg | ORAL_TABLET | ORAL | Status: DC | PRN

## 2018-08-26 MED ORDER — ONDANSETRON HCL 4 MG/2ML IJ SOLN: 4.0000 mg | Freq: Four times a day (QID) | INTRAMUSCULAR | Status: DC | PRN

## 2018-08-26 MED ORDER — OXYCODONE-ACETAMINOPHEN 5-325 MG PO TABS: 2.0000 | ORAL_TABLET | ORAL | Status: DC | PRN

## 2018-08-26 MED ORDER — LACTATED RINGERS IV SOLN
500.0000 mL | Freq: Once | INTRAVENOUS | Status: AC
  Administered 2018-08-26: 21:00:00 500 mL via INTRAVENOUS

## 2018-08-26 MED ORDER — SOD CITRATE-CITRIC ACID 500-334 MG/5ML PO SOLN: 30.0000 mL | ORAL | Status: DC | PRN

## 2018-08-26 MED ORDER — LIDOCAINE HCL (PF) 1 % IJ SOLN
INTRAMUSCULAR | Status: DC | PRN
  Administered 2018-08-26 (×2): 5 mL via EPIDURAL

## 2018-08-26 MED ORDER — EPHEDRINE 5 MG/ML INJ: 10.0000 mg | INTRAVENOUS | Status: DC | PRN

## 2018-08-26 MED ORDER — FENTANYL-BUPIVACAINE-NACL 0.5-0.125-0.9 MG/250ML-% EP SOLN
12.0000 mL/h | EPIDURAL | Status: DC | PRN
  Filled 2018-08-26: qty 250

## 2018-08-26 MED ORDER — OXYTOCIN 40 UNITS IN NORMAL SALINE INFUSION - SIMPLE MED
2.5000 [IU]/h | INTRAVENOUS | Status: DC
  Filled 2018-08-26: qty 1000

## 2018-08-26 MED ORDER — LACTATED RINGERS IV SOLN: 500.0000 mL | INTRAVENOUS | Status: DC | PRN

## 2018-08-26 MED ORDER — SODIUM CHLORIDE 0.9 % IV SOLN
2.0000 g | Freq: Four times a day (QID) | INTRAVENOUS | Status: DC
  Administered 2018-08-26: 2 g via INTRAVENOUS
  Administered 2018-08-27: 06:00:00 via INTRAVENOUS
  Filled 2018-08-26 (×2): qty 2000

## 2018-08-26 MED ORDER — OXYCODONE-ACETAMINOPHEN 5-325 MG PO TABS: 1.0000 | ORAL_TABLET | ORAL | Status: DC | PRN

## 2018-08-26 MED ORDER — PHENYLEPHRINE 40 MCG/ML (10ML) SYRINGE FOR IV PUSH (FOR BLOOD PRESSURE SUPPORT): 80.0000 ug | PREFILLED_SYRINGE | INTRAVENOUS | Status: DC | PRN

## 2018-08-26 MED ORDER — PHENYLEPHRINE 40 MCG/ML (10ML) SYRINGE FOR IV PUSH (FOR BLOOD PRESSURE SUPPORT)
80.0000 ug | PREFILLED_SYRINGE | INTRAVENOUS | Status: DC | PRN
  Filled 2018-08-26: qty 10

## 2018-08-26 MED ORDER — LACTATED RINGERS IV SOLN
INTRAVENOUS | Status: DC
  Administered 2018-08-26 – 2018-08-27 (×2): via INTRAVENOUS

## 2018-08-26 MED ORDER — LIDOCAINE HCL (PF) 1 % IJ SOLN: 30.0000 mL | INTRAMUSCULAR | Status: DC | PRN

## 2018-08-26 MED ORDER — SODIUM CHLORIDE (PF) 0.9 % IJ SOLN
INTRAMUSCULAR | Status: DC | PRN
  Administered 2018-08-26: 12 mL/h via EPIDURAL

## 2018-08-26 MED ORDER — OXYTOCIN BOLUS FROM INFUSION
500.0000 mL | Freq: Once | INTRAVENOUS | Status: AC
  Administered 2018-08-27: 500 mL via INTRAVENOUS

## 2018-08-26 NOTE — Anesthesia Preprocedure Evaluation (Addendum)
Anesthesia Evaluation  Patient identified by MRN, date of birth, ID band Patient awake    Reviewed: Allergy & Precautions, H&P , NPO status , Patient's Chart, lab work & pertinent test results, reviewed documented beta blocker date and time   Airway Mallampati: II  TM Distance: >3 FB Neck ROM: full    Dental no notable dental hx. (+) Dental Advisory Given   Pulmonary neg pulmonary ROS, former smoker,    Pulmonary exam normal        Cardiovascular negative cardio ROS Normal cardiovascular exam     Neuro/Psych negative neurological ROS  negative psych ROS   GI/Hepatic negative GI ROS, Neg liver ROS,   Endo/Other  negative endocrine ROS  Renal/GU negative Renal ROS  negative genitourinary   Musculoskeletal negative musculoskeletal ROS (+)   Abdominal   Peds negative pediatric ROS (+)  Hematology negative hematology ROS (+)   Anesthesia Other Findings   Reproductive/Obstetrics (+) Pregnancy                            Anesthesia Physical  Anesthesia Plan  ASA: II  Anesthesia Plan: Epidural   Post-op Pain Management:    Induction:   PONV Risk Score and Plan:   Airway Management Planned: Natural Airway  Additional Equipment:   Intra-op Plan:   Post-operative Plan:   Informed Consent: I have reviewed the patients History and Physical, chart, labs and discussed the procedure including the risks, benefits and alternatives for the proposed anesthesia with the patient or authorized representative who has indicated his/her understanding and acceptance.     Dental Advisory Given  Plan Discussed with: CRNA and Surgeon  Anesthesia Plan Comments:         Anesthesia Quick Evaluation

## 2018-08-26 NOTE — MAU Note (Signed)
Pt here with c/o contractions since 0700 this morning. Was check in the office today and was 2-4 cm and membranes swept. Reports good fetal movement.

## 2018-08-26 NOTE — H&P (Signed)
Kristine Oconnell is a 28 y.o. female presenting for painful uterine contractions.  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  Desires epidural analgesia  Patient Active Problem List   Diagnosis Date Noted  . Uterine contractions during pregnancy 08/26/2018  . Supervision of other normal pregnancy, antepartum 04/12/2018  . History of gestational hypertension 02/11/2017  . Genital herpes affecting pregnancy, antepartum 02/02/2017  . Tobacco smoking affecting pregnancy, antepartum 08/13/2016   RN Note: UC STARTED  0700.  -  DILATED 2-4 IN OFFICE  TODAY.  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  DENIES ANY S/S OF AN OUTBREAK NOW..  DENIES MRSA.  GBS- NEG  OB History    Gravida  4   Para  1   Term  1   Preterm      AB  2   Living  1     SAB  1   TAB  1   Ectopic      Multiple  0   Live Births  1          Past Medical History:  Diagnosis Date  . Hernia, inguinal    Past Surgical History:  Procedure Laterality Date  . HERNIA REPAIR     Family History: family history includes Cancer in her maternal grandfather and maternal grandmother; Diabetes in her maternal aunt; Hypertension in her maternal aunt and mother; Stroke in her maternal grandfather. Social History:  reports that she has quit smoking. She has never used smokeless tobacco. She reports previous alcohol use. She reports that she does not use drugs.     Maternal Diabetes: No Genetic Screening: Declined Maternal Ultrasounds/Referrals: Normal Fetal Ultrasounds or other Referrals:  None Maternal Substance Abuse:  No Significant Maternal Medications:  None Significant Maternal Lab Results:  Lab values include: Group B Strep negative Other Comments:  None  Review of Systems  Constitutional: Negative for chills and fever.  Eyes: Negative for blurred vision.  Respiratory: Negative for shortness of breath.   Gastrointestinal: Positive for abdominal pain. Negative for  constipation, diarrhea, nausea and vomiting.  Neurological: Negative for weakness.   Maternal Medical History:  Reason for admission: Contractions.  Nausea.  Contractions: Onset was 13-24 hours ago.   Frequency: regular.   Perceived severity is strong.    Fetal activity: Perceived fetal activity is normal.   Last perceived fetal movement was within the past hour.    Prenatal complications: No bleeding, PIH, pre-eclampsia or preterm labor.   Prenatal Complications - Diabetes: none.    Dilation: 4.5 Effacement (%): 90 Station: -2 Exam by:: JESSICA, CNM Blood pressure 139/86, pulse 84, temperature 98.6 F (37 C), temperature source Oral, resp. rate 20, height 5\' 9"  (1.753 m), weight 103.9 kg, unknown if currently breastfeeding. Maternal Exam:  Uterine Assessment: Contraction strength is firm.  Contraction frequency is regular.   Abdomen: Patient reports no abdominal tenderness. Estimated fetal weight is 6.5lbs.   Fetal presentation: vertex  Introitus: Normal vulva. Normal vagina.  Ferning test: not done.  Nitrazine test: not done.  Pelvis: adequate for delivery.   Cervix: Cervix evaluated by digital exam.     Fetal Exam Fetal Monitor Review: Mode: ultrasound.   Baseline rate: 145.  Variability: moderate (6-25 bpm).   Pattern: accelerations present and no decelerations.    Fetal State Assessment: Category I - tracings are normal.     Physical Exam  Constitutional: She is oriented to person, place, and time. She appears well-developed  and well-nourished. No distress.  HENT:  Head: Normocephalic.  Cardiovascular: Normal rate.  Respiratory: Effort normal. No respiratory distress.  GI: Soft. She exhibits no distension. There is no abdominal tenderness. There is no rebound and no guarding.  Genitourinary:    Vulva normal.     Genitourinary Comments: Dilation: 4.5 Effacement (%): 90 Station: -2 Presentation: Vertex Exam by:: JESSICA, CNM    Musculoskeletal:  Normal range of motion.  Neurological: She is alert and oriented to person, place, and time.  Skin: Skin is warm and dry.  Psychiatric: She has a normal mood and affect.    Prenatal labs: ABO, Rh: O/Positive/-- (12/23 1430) Antibody: Negative (12/23 1430) Rubella: 11.00 (12/23 1430) RPR: Non Reactive (02/27 0843)  HBsAg: Negative (12/23 1430)  HIV: Non Reactive (02/27 0843)  GBS:     Assessment/Plan: SIngle intrauterine pregnancy at [redacted]w[redacted]d Latent labor, bordering on active phase Group B strep negative  Admit to Labor and Delivery Routine orders Epidural prn Anticipate SVD   Wynelle Bourgeois 08/26/2018, 8:55 PM

## 2018-08-26 NOTE — Anesthesia Procedure Notes (Signed)
Epidural Patient location during procedure: OB Start time: 08/26/2018 10:11 PM End time: 08/26/2018 10:26 PM  Staffing Anesthesiologist: Heather Roberts, MD Performed: anesthesiologist   Preanesthetic Checklist Completed: patient identified, site marked, pre-op evaluation, timeout performed, IV checked, risks and benefits discussed and monitors and equipment checked  Epidural Patient position: sitting Prep: DuraPrep Patient monitoring: heart rate, cardiac monitor, continuous pulse ox and blood pressure Approach: midline Location: L2-L3 Injection technique: LOR saline  Needle:  Needle type: Tuohy  Needle gauge: 17 G Needle length: 9 cm Needle insertion depth: 7 cm Catheter size: 20 Guage Catheter at skin depth: 12 cm Test dose: negative and Other  Assessment Events: blood not aspirated, injection not painful, no injection resistance and negative IV test  Additional Notes Informed consent obtained prior to proceeding including risk of failure, 1% risk of PDPH, risk of minor discomfort and bruising.  Discussed rare but serious complications including epidural abscess, permanent nerve injury, epidural hematoma.  Discussed alternatives to epidural analgesia and patient desires to proceed.  Timeout performed pre-procedure verifying patient name, procedure, and platelet count.  Patient tolerated procedure well.

## 2018-08-26 NOTE — MAU Note (Signed)
UC STARTED  0700.  -  DILATED 2-4 IN OFFICE  TODAY.  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  DENIES ANY S/S OF AN OUTBREAK NOW..  DENIES MRSA.  GBS- NEG

## 2018-08-26 NOTE — Progress Notes (Signed)
   PRENATAL VISIT NOTE  Subjective:  Kristine Oconnell is a 28 y.o. G4P1021 at [redacted]w[redacted]d being seen today for ongoing prenatal care.  She is currently monitored for the following issues for this low-risk pregnancy and has Tobacco smoking affecting pregnancy, antepartum; Genital herpes affecting pregnancy, antepartum; History of gestational hypertension; and Supervision of other normal pregnancy, antepartum on their problem list.  Patient reports occasional contractions that are sometimes painful.  Contractions: Irregular. Vag. Bleeding: Bloody Show, Scant.  Movement: Present. Denies leaking of fluid.   The following portions of the patient's history were reviewed and updated as appropriate: allergies, current medications, past family history, past medical history, past social history, past surgical history and problem list.   Objective:   Vitals:   08/26/18 1542 08/26/18 1548  BP: (!) 154/93 133/81  Pulse: (!) 101 86  Temp: 98.1 F (36.7 C)   Weight: 229 lb 3.2 oz (104 kg)     Fetal Status: Fetal Heart Rate (bpm): 142 Fundal Height: 40 cm Movement: Present  Presentation: Vertex  General:  Alert, oriented and cooperative. Patient is in no acute distress.  Skin: Skin is warm and dry. No rash noted.   Cardiovascular: Normal heart rate noted  Respiratory: Normal respiratory effort, no problems with respiration noted  Abdomen: Soft, gravid, appropriate for gestational age.  Pain/Pressure: Present     Pelvic: Cervical exam performed Dilation: 2.5 Effacement (%): 80 Station: -2  Extremities: Normal range of motion.  Edema: None  Mental Status: Normal mood and affect. Normal behavior. Normal judgment and thought content.   Assessment and Plan:  Pregnancy: G4P1021 at [redacted]w[redacted]d 1. Encounter for supervision of other normal pregnancy in third trimester - Membranes stripped with verbal consent from patient - Advised to have SI to possibly help stimulate labor  2. History of gestational hypertension -  Patient not taking bASA -- declined - Elevated BP today, but repeat was normal  3. Genital herpes affecting pregnancy, antepartum - Declines to take Valtrex Rx'd - No visual lesions externally - Advised that she will be examined for lesions at the time of admission. If a lesion is found on genital area or cervix, she will need to have a PLTCS. Patient verbalized an understanding and agrees.   Term labor symptoms and general obstetric precautions including but not limited to vaginal bleeding, contractions, leaking of fluid and fetal movement were reviewed in detail with the patient. Please refer to After Visit Summary for other counseling recommendations.   Return in about 1 week (around 09/02/2018) for Return OB visit.  Future Appointments  Date Time Provider Department Center  09/02/2018  9:40 AM Raelyn Mora, CNM [facility] None    Raelyn Mora, CNM

## 2018-08-26 NOTE — Progress Notes (Signed)
Patient ID: Kristine Oconnell, female   DOB: 1990/11/19, 28 y.o.   MRN: [id number] Now comfortable with epidural Fetal heart rate baseline is elevated to 160s  Temp 100.3 per RN  No other symptoms Results for orders placed or performed during the hospital encounter of 08/26/18 (from the past 24 hour(s))  Type and screen [facility]     Status: None   Collection Time: 08/26/18  9:00 PM  Result Value Ref Range   ABO/RH(D) O POS    Antibody Screen NEG    Sample Expiration      08/29/2018,2359 Performed at [facility], [address] [place], [place] [address]   CBC     Status: None   Collection Time: 08/26/18  9:41 PM  Result Value Ref Range   WBC 9.8 4.0 - 10.5 K/uL   RBC 4.20 3.87 - 5.11 MIL/uL   Hemoglobin 12.2 12.0 - 15.0 g/dL   HCT 43.3 29.5 - 18.8 %   MCV 87.4 80.0 - 100.0 fL   MCH 29.0 26.0 - 34.0 pg   MCHC 33.2 30.0 - 36.0 g/dL   RDW 41.6 60.6 - 30.1 %   Platelets 174 150 - 400 K/uL   nRBC 0.0 0.0 - 0.2 %   Will start on amp/gent prophylactically  Wynelle Bourgeois CNM

## 2018-08-27 ENCOUNTER — Encounter (HOSPITAL_COMMUNITY): Payer: Self-pay | Admitting: *Deleted

## 2018-08-27 DIAGNOSIS — Z3A39 39 weeks gestation of pregnancy: Secondary | ICD-10-CM

## 2018-08-27 DIAGNOSIS — O411290 Chorioamnionitis, unspecified trimester, not applicable or unspecified: Secondary | ICD-10-CM | POA: Diagnosis not present

## 2018-08-27 LAB — RPR: RPR Ser Ql: NONREACTIVE

## 2018-08-27 MED ORDER — SIMETHICONE 80 MG PO CHEW: 80.0000 mg | CHEWABLE_TABLET | ORAL | Status: DC | PRN

## 2018-08-27 MED ORDER — TERBUTALINE SULFATE 1 MG/ML IJ SOLN: 0.2500 mg | Freq: Once | INTRAMUSCULAR | Status: DC | PRN

## 2018-08-27 MED ORDER — PRENATAL MULTIVITAMIN CH
1.0000 | ORAL_TABLET | Freq: Every day | ORAL | Status: DC
  Administered 2018-08-28: 1 via ORAL
  Filled 2018-08-27: qty 1

## 2018-08-27 MED ORDER — TETANUS-DIPHTH-ACELL PERTUSSIS 5-2.5-18.5 LF-MCG/0.5 IM SUSP: 0.5000 mL | Freq: Once | INTRAMUSCULAR | Status: DC

## 2018-08-27 MED ORDER — ACETAMINOPHEN 325 MG PO TABS: 650.0000 mg | ORAL_TABLET | ORAL | Status: DC | PRN

## 2018-08-27 MED ORDER — IBUPROFEN 600 MG PO TABS
600.0000 mg | ORAL_TABLET | Freq: Four times a day (QID) | ORAL | Status: DC
  Administered 2018-08-27 – 2018-08-28 (×3): 600 mg via ORAL
  Filled 2018-08-27 (×5): qty 1

## 2018-08-27 MED ORDER — SENNOSIDES-DOCUSATE SODIUM 8.6-50 MG PO TABS
2.0000 | ORAL_TABLET | ORAL | Status: DC
  Filled 2018-08-27: qty 2

## 2018-08-27 MED ORDER — ONDANSETRON HCL 4 MG/2ML IJ SOLN: 4.0000 mg | INTRAMUSCULAR | Status: DC | PRN

## 2018-08-27 MED ORDER — WITCH HAZEL-GLYCERIN EX PADS: 1.0000 "application " | MEDICATED_PAD | CUTANEOUS | Status: DC | PRN

## 2018-08-27 MED ORDER — COCONUT OIL OIL: 1.0000 "application " | TOPICAL_OIL | Status: DC | PRN

## 2018-08-27 MED ORDER — OXYCODONE HCL 5 MG PO TABS: 5.0000 mg | ORAL_TABLET | ORAL | Status: DC | PRN

## 2018-08-27 MED ORDER — BENZOCAINE-MENTHOL 20-0.5 % EX AERO
1.0000 "application " | INHALATION_SPRAY | CUTANEOUS | Status: DC | PRN
  Filled 2018-08-27: qty 56

## 2018-08-27 MED ORDER — ONDANSETRON HCL 4 MG PO TABS: 4.0000 mg | ORAL_TABLET | ORAL | Status: DC | PRN

## 2018-08-27 MED ORDER — OXYTOCIN 40 UNITS IN NORMAL SALINE INFUSION - SIMPLE MED
1.0000 m[IU]/min | INTRAVENOUS | Status: DC
  Administered 2018-08-27: 2 m[IU]/min via INTRAVENOUS

## 2018-08-27 MED ORDER — DIPHENHYDRAMINE HCL 25 MG PO CAPS: 25.0000 mg | ORAL_CAPSULE | Freq: Four times a day (QID) | ORAL | Status: DC | PRN

## 2018-08-27 MED ORDER — ZOLPIDEM TARTRATE 5 MG PO TABS: 5.0000 mg | ORAL_TABLET | Freq: Every evening | ORAL | Status: DC | PRN

## 2018-08-27 MED ORDER — DIBUCAINE (PERIANAL) 1 % EX OINT: 1.0000 "application " | TOPICAL_OINTMENT | CUTANEOUS | Status: DC | PRN

## 2018-08-27 NOTE — Discharge Summary (Signed)
Postpartum Discharge Summary     Patient Name: Kristine Oconnell DOB: May 19, 1990 MRN: [id number]  Date of admission: 08/26/2018 Delivering Provider: Cam Hai D   Date of discharge: 08/28/2018  Admitting diagnosis: ctx 1 min Intrauterine pregnancy: [redacted]w[redacted]d     Secondary diagnosis:  Active Problems:   History of gestational hypertension   Uterine contractions during pregnancy   Chorioamnionitis   Postpartum hypertension  Additional problems: none     Discharge diagnosis: Term Pregnancy Delivered                                                                                                Post partum procedures:none  Augmentation: AROM and Pitocin  Complications: Intrauterine Inflammation or infection (Chorioamniotis)  Hospital course:  Onset of Labor With Vaginal Delivery     28 y.o. yo N5A2130 at [redacted]w[redacted]d was admitted in Latent Labor on the evening of 08/26/2018. Within a few hours of arrival, with membranes still intact, pt developed a T of 100.3 and was tx with two doses each of Amp/Gent prior to delivery. FHR remained at a normal rate. With AROM and Pitocin she progressed to SVD the following morning.   Membrane Rupture Time/Date: 2:15 AM ,08/27/2018   Intrapartum Procedures: Episiotomy: None [1]                                         Lacerations:  None [1]  Patient had a delivery of a Viable infant. 08/27/2018  Information for the patient's newborn:  Shaniel, Selmon [[id number]]  Delivery Method: Vaginal, Spontaneous(Filed from Delivery Summary)    Pateint had a postpartum course remarkable for being started on Vasotec 5mg  prior to discharge on PPD#1.  She is ambulating, tolerating a regular diet, passing flatus, and urinating well. Patient is discharged home in stable condition on 08/28/18, per her request for early discharge, as long as the baby is cleared to go.   Magnesium Sulfate recieved: No BMZ received: No  Physical exam  Vitals:   08/27/18 1545 08/27/18 2200  08/28/18 0200 08/28/18 0536  BP: 125/88 124/83 (!) 139/99 (!) 131/97  Pulse: 78 82 81 66  Resp: 16 17 16 18   Temp: 98.3 F (36.8 C) 98.6 F (37 C) 98.3 F (36.8 C)   TempSrc: Oral Oral Oral   SpO2: 99% 99% 100%   Weight:      Height:       General: alert and cooperative Lochia: appropriate Uterine Fundus: firm Incision: N/A DVT Evaluation: No evidence of DVT seen on physical exam. Labs: Lab Results  Component Value Date   WBC 9.8 08/26/2018   HGB 12.2 08/26/2018   HCT 36.7 08/26/2018   MCV 87.4 08/26/2018   PLT 174 08/26/2018   CMP Latest Ref Rng & Units 06/17/2018  Glucose 65 - 99 mg/dL 77  BUN 6 - 20 mg/dL 5(L)  Creatinine 2.95 - 1.00 mg/dL 2.84  Sodium 132 - 440 mmol/L 138  Potassium 3.5 - 5.2 mmol/L 4.3  Chloride 96 -  106 mmol/L 103  CO2 20 - 29 mmol/L 21  Calcium 8.7 - 10.2 mg/dL 8.1(X8.5(L)  Total Protein 6.0 - 8.5 g/dL 6.3  Total Bilirubin 0.0 - 1.2 mg/dL <9.1<0.2  Alkaline Phos 39 - 117 IU/L 77  AST 0 - 40 IU/L 10  ALT 0 - 32 IU/L 8    Discharge instruction: per After Visit Summary and "Baby and Me Booklet".  After visit meds:  Allergies as of 08/28/2018      Reactions   Sulfa Antibiotics Rash      Medication List    TAKE these medications   enalapril 5 MG tablet Commonly known as:  VASOTEC Take 1 tablet (5 mg total) by mouth daily.   ibuprofen 600 MG tablet Commonly known as:  ADVIL Take 1 tablet (600 mg total) by mouth every 6 (six) hours as needed.       Diet: routine diet  Activity: Advance as tolerated. Pelvic rest for 6 weeks.   Outpatient follow up:1 wk for BP check, then 4 wks for PP visit; wants to sign BTL papers    Follow up Appt: Future Appointments  Date Time Provider Department Center  09/02/2018  9:40 AM Raelyn Moraawson, Rolitta, CNM [facility] None   Follow up Visit: Follow-up Information    [facility]. Schedule an appointment as soon as possible for a visit in 1 week(s).   Specialty:  Obstetrics and Gynecology Why:   for a blood pressure check, then in 4 weeks for a postpartum visit Contact information: [address] [place] [address] [address] [place] [phone]          Please schedule this patient for Postpartum visit in: 4 weeks with the following provider: Any provider For C/S patients schedule nurse incision check in weeks 2 weeks: no Low risk pregnancy complicated by: none Delivery mode:  SVD Anticipated Birth Control:  other/unsure PP Procedures needed: none  Schedule Integrated BH visit: no    Newborn Data: Live born female  Birth Weight: 4095 gm (9lb 0.5oz)  APGAR: 8, 9  Newborn Delivery   Birth date/time:  08/27/2018 10:06:00 Delivery type:  Vaginal, Spontaneous     Baby Feeding: Breast Disposition:home with mother, pending peds   08/28/2018 Arabella MerlesKimberly D Aretha Levi, CNM  9:07 AM

## 2018-08-27 NOTE — Progress Notes (Addendum)
Pharmacy Antibiotic Note  Kristine Oconnell is a 28 y.o. female admitted on 08/26/2018 in labor.  GBS negative, laboring, now with maternal fever.  Pharmacy has been consulted for gentamicin dosing.  Plan: gentamicin 220mg  IV Q8 hours  Scr if gent continued more than 24 hours Follow clinically  Height: 5\' 9"  (175.3 cm) Weight: 229 lb (103.9 kg) IBW/kg (Calculated) : 66.2  Temp (24hrs), Avg:98.8 F (37.1 C), Min:98.1 F (36.7 C), Max:100.3 F (37.9 C)  Recent Labs  Lab 08/26/18 2141  WBC 9.8    CrCl cannot be calculated (Patient's most recent lab result is older than the maximum 21 days allowed.).    Allergies  Allergen Reactions  . Sulfa Antibiotics Rash    Antimicrobials this admission: Ampicillin 2 grams Q6  5/7 >>  Gentamicin 220mg  IV Q 8 hours 5/7 >>    Thank you for allowing pharmacy to be a part of this patient's care.  Loyola Mast 08/27/2018 3:42 AM

## 2018-08-27 NOTE — Progress Notes (Signed)
Patient ID: Kristine Oconnell, female   DOB: [birth year], 28 y.o.   MRN: [id number]  Pitocin started at 0830 because ctx had spaced out  BP 126/84, P 81 FHR 145-150, +accels, min-mod variability Ctx q 2 mins with Pit @ 74mu/min Cx 9/C/-1 per RN  IUP@term  Active labor/transition GBS neg  Anticipating delivering in next couple of hours  [name] CNM 08/27/2018 9:30 AM

## 2018-08-27 NOTE — Anesthesia Postprocedure Evaluation (Signed)
Anesthesia Post Note  Patient: Kristine Oconnell  Procedure(s) Performed: AN AD HOC LABOR EPIDURAL     Patient location during evaluation: Mother Baby Anesthesia Type: Epidural Level of consciousness: awake and alert Pain management: pain level controlled Vital Signs Assessment: post-procedure vital signs reviewed and stable Respiratory status: spontaneous breathing, nonlabored ventilation and respiratory function stable Cardiovascular status: stable Postop Assessment: no headache, no backache and epidural receding Anesthetic complications: no    Last Vitals:  Vitals:   08/27/18 1115 08/27/18 1131  BP: 118/77 119/72  Pulse: 84 82  Resp: 18 16  Temp:  36.9 C  SpO2:      Last Pain:  Vitals:   08/27/18 1131  TempSrc: Oral  PainSc: 0-No pain   Pain Goal:                   Marquette Saa

## 2018-08-27 NOTE — Progress Notes (Signed)
Patient ID: Kristine Oconnell, female   DOB: [birth year], 28 y.o.   MRN: [id number] Comfortable with epidural  Vitals:   08/27/18 0031 08/27/18 0101 08/27/18 0131 08/27/18 0201  BP: 128/81 120/72 113/63 (!) 104/56  Pulse: 81 97 82 78  Resp:  16    Temp:      TempSrc:      SpO2:      Weight:      Height:       FHR reassuring UCs not tracing well  Dilation: 5 Effacement (%): 90 Station: -2, -1 Presentation: Vertex Exam by:: Wynelle Bourgeois, CNM  AROM clear fluid, large amount  Anticipate increased labor

## 2018-08-27 NOTE — Lactation Note (Signed)
This note was copied from a baby's chart. Lactation Consultation Note  Patient Name: Kristine Oconnell Date: 08/27/2018 Reason for consult: Initial assessment;Term P2, 12 hour female infant, LGA Per mom, infant been very sleepy and not latching well. Per mom, infant had one void and one stool since delivery.  Mom breastfeed her 50 month old daughter for six month but stopped when infant started teething. LC discussed with mom, ways to help mom when infant is teething this was one of mom's concerns she wants to breastfeed 2nd child longer. Mom receives [facility] in [place]. Infant was reluctant to breastfeed at this time. LC encouraged mom to do as much STS as possible and mom taught back hand expression and infant was given 8 ml of  Colostrum by spoon. Mom knows to breastfeed infant according hunger cues, 8 or more times within 24 hours. LC discussed I & O. Reviewed Baby & Me book's Breastfeeding Basics.  Mom knows to call Nurse or LC if she has any questions, concerns or need assistance with latching infant to breast. LC gave mom harmony hand pump and explained  how to use manual hand pump  & how to disassemble, clean, & reassemble parts.  LC discussed [facility] and Mom made aware of O/P services, breastfeeding support groups, community resources, and our phone # for post-discharge questions.    Maternal Data Formula Feeding for Exclusion: No Has patient been taught Hand Expression?: Yes(Infant was given 8 ml of colostrum by spoon.) Does the patient have breastfeeding experience prior to this delivery?: Yes  Feeding Feeding Type: Breast Fed  LATCH Score                   Interventions Interventions: Breast feeding basics reviewed;Skin to skin;Hand express;Expressed milk;Position options;Hand pump  Lactation Tools Discussed/Used WIC Program: Yes Pump Review: Setup, frequency, and cleaning;Milk Storage Initiated by:: Danelle Earthly, IBCLC Date initiated:: 08/27/18   Consult Status Consult Status: Follow-up Date: 08/27/18 Follow-up type: In-patient    Danelle Earthly 08/27/2018, 10:09 PM

## 2018-08-27 NOTE — Progress Notes (Signed)
Patient ID: Shenetha Gellner, female   DOB: 05-16-1990, 28 y.o.   MRN: [id number] Doing well, comfortable  Vitals:   08/27/18 0431 08/27/18 0446 08/27/18 0501 08/27/18 0531  BP: 114/68  125/60 123/81  Pulse: 78  79 76  Resp:   17   Temp:  98.9 F (37.2 C)    TempSrc:  Axillary    SpO2:      Weight:      Height:       Fetal heart rate stable, small accelerations  UCs every 2 min  Dilation: 6 Effacement (%): 80 Cervical Position: Middle Station: -1 Presentation: Vertex Exam by:: Dalia Jollie CNM  Slow but steady progress Will continue to observe

## 2018-08-28 DIAGNOSIS — I10 Essential (primary) hypertension: Secondary | ICD-10-CM | POA: Diagnosis not present

## 2018-08-28 DIAGNOSIS — O165 Unspecified maternal hypertension, complicating the puerperium: Secondary | ICD-10-CM | POA: Diagnosis not present

## 2018-08-28 MED ORDER — ENALAPRIL MALEATE 5 MG PO TABS
5.0000 mg | ORAL_TABLET | Freq: Every day | ORAL | Status: DC
  Administered 2018-08-28: 5 mg via ORAL
  Filled 2018-08-28: qty 1

## 2018-08-28 MED ORDER — IBUPROFEN 600 MG PO TABS: 600.0000 mg | ORAL_TABLET | Freq: Four times a day (QID) | ORAL | 0 refills | Status: DC | PRN

## 2018-08-28 MED ORDER — ENALAPRIL MALEATE 5 MG PO TABS: 5.0000 mg | ORAL_TABLET | Freq: Every day | ORAL | 1 refills | Status: DC

## 2018-08-28 NOTE — Discharge Instructions (Signed)
Postpartum Hypertension °Postpartum hypertension is high blood pressure that remains higher than normal after childbirth. You may not realize that you have postpartum hypertension if your blood pressure is not being checked regularly. In most cases, postpartum hypertension will go away on its own, usually within a week of delivery. However, for some women, medical treatment is required to prevent serious complications, such as seizures or stroke. °What are the causes? °This condition may be caused by one or more of the following: °· Hypertension that existed before pregnancy (chronic hypertension). °· Hypertension that comes on as a result of pregnancy (gestational hypertension). °· Hypertensive disorders during pregnancy (preeclampsia) or seizures in women who have high blood pressure during pregnancy (eclampsia). °· A condition in which the liver, platelets, and red blood cells are damaged during pregnancy (HELLP syndrome). °· A condition in which the thyroid produces too much hormones (hyperthyroidism). °· Other rare problems of the nerves (neurological disorders) or blood disorders. °In some cases, the cause may not be known. °What increases the risk? °The following factors may make you more likely to develop this condition: °· Chronic hypertension. In some cases, this may not have been diagnosed before pregnancy. °· Obesity. °· Type 2 diabetes. °· Kidney disease. °· History of preeclampsia or eclampsia. °· Other medical conditions that change the level of hormones in the body (hormonal imbalance). °What are the signs or symptoms? °As with all types of hypertension, postpartum hypertension may not have any symptoms. Depending on how high your blood pressure is, you may experience: °· Headaches. These may be mild, moderate, or severe. They may also be steady, constant, or sudden in onset (thunderclap headache). °· Changes in your ability to see (visual changes). °· Dizziness. °· Shortness of breath. °· Swelling  of your hands, feet, lower legs, or face. In some cases, you may have swelling in more than one of these locations. °· Heart palpitations or a racing heartbeat. °· Difficulty breathing while lying down. °· Decrease in the amount of urine that you pass. °Other rare signs and symptoms may include: °· Sweating more than usual. This lasts longer than a few days after delivery. °· Chest pain. °· Sudden dizziness when you get up from sitting or lying down. °· Seizures. °· Nausea or vomiting. °· Abdominal pain. °How is this diagnosed? °This condition may be diagnosed based on the results of a physical exam, blood pressure measurements, and blood and urine tests. °You may also have other tests, such as a CT scan or an MRI, to check for other problems of postpartum hypertension. °How is this treated? °If blood pressure is high enough to require treatment, your options may include: °· Medicines to reduce blood pressure (antihypertensives). Tell your health care provider if you are breastfeeding or if you plan to breastfeed. There are many antihypertensive medicines that are safe to take while breastfeeding. °· Stopping medicines that may be causing hypertension. °· Treating medical conditions that are causing hypertension. °· Treating the complications of hypertension, such as seizures, stroke, or kidney problems. °Your health care provider will also continue to monitor your blood pressure closely until it is within a safe range for you. °Follow these instructions at home: °· Take over-the-counter and prescription medicines only as told by your health care provider. °· Return to your normal activities as told by your health care provider. Ask your health care provider what activities are safe for you. °· Do not use any products that contain nicotine or tobacco, such as cigarettes and e-cigarettes. If   you need help quitting, ask your health care provider. °· Keep all follow-up visits as told by your health care provider. This  is important. °Contact a health care provider if: °· Your symptoms get worse. °· You have new symptoms, such as: °? A headache that does not get better. °? Dizziness. °? Visual changes. °Get help right away if: °· You suddenly develop swelling in your hands, ankles, or face. °· You have sudden, rapid weight gain. °· You develop difficulty breathing, chest pain, racing heartbeat, or heart palpitations. °· You develop severe pain in your abdomen. °· You have any symptoms of a stroke. "BE FAST" is an easy way to remember the main warning signs of a stroke: °? B - Balance. Signs are dizziness, sudden trouble walking, or loss of balance. °? E - Eyes. Signs are trouble seeing or a sudden change in vision. °? F - Face. Signs are sudden weakness or numbness of the face, or the face or eyelid drooping on one side. °? A - Arms. Signs are weakness or numbness in an arm. This happens suddenly and usually on one side of the body. °? S - Speech. Signs are sudden trouble speaking, slurred speech, or trouble understanding what people say. °? T - Time. Time to call emergency services. Write down what time symptoms started. °· You have other signs of a stroke, such as: °? A sudden, severe headache with no known cause. °? Nausea or vomiting. °? Seizure. °These symptoms may represent a serious problem that is an emergency. Do not wait to see if the symptoms will go away. Get medical help right away. Call your local emergency services (911 in the [place] Do not drive yourself to the hospital. °Summary °· Postpartum hypertension is high blood pressure that remains higher than normal after childbirth. °· In most cases, postpartum hypertension will go away on its own, usually within a week of delivery. °· For some women, medical treatment is required to prevent serious complications, such as seizures or stroke. °This information is not intended to replace advice given to you by your health care provider. Make sure you discuss any questions  you have with your health care provider. °Document Released: 12/09/2013 Document Revised: 01/26/2017 Document Reviewed: 01/26/2017 °Elsevier Interactive Patient Education © 2019 [employer] °Vaginal Delivery, Care After °Refer to this sheet in the next few weeks. These instructions provide you with information about caring for yourself after vaginal delivery. Your health care provider may also give you more specific instructions. Your treatment has been planned according to current medical practices, but problems sometimes occur. Call your health care provider if you have any problems or questions. °What can I expect after the procedure? °After vaginal delivery, it is common to have: °· Some bleeding from your vagina. °· Soreness in your abdomen, your vagina, and the area of skin between your vaginal opening and your anus (perineum). °· Pelvic cramps. °· Fatigue. °Follow these instructions at home: °Medicines °· Take over-the-counter and prescription medicines only as told by your health care provider. °· If you were prescribed an antibiotic medicine, take it as told by your health care provider. Do not stop taking the antibiotic until it is finished. °Driving ° °· Do not drive or operate heavy machinery while taking prescription pain medicine. °· Do not drive for 24 hours if you received a sedative. °Lifestyle °· Do not drink alcohol. This is especially important if you are breastfeeding or taking medicine to relieve pain. °· Do not use tobacco   products, including cigarettes, chewing tobacco, or e-cigarettes. If you need help quitting, ask your health care provider. °Eating and drinking °· Drink at least 8 eight-ounce glasses of water every day unless you are told not to by your health care provider. If you choose to breastfeed your baby, you may need to drink more water than this. °· Eat high-fiber foods every day. These foods may help prevent or relieve constipation. High-fiber foods include: °? Whole grain  cereals and breads. °? Brown rice. °? Beans. °? Fresh fruits and vegetables. °Activity °· Return to your normal activities as told by your health care provider. Ask your health care provider what activities are safe for you. °· Rest as much as possible. Try to rest or take a nap when your baby is sleeping. °· Do not lift anything that is heavier than your baby or 10 lb (4.5 kg) until your health care provider says that it is safe. °· Talk with your health care provider about when you can engage in sexual activity. This may depend on your: °? Risk of infection. °? Rate of healing. °? Comfort and desire to engage in sexual activity. °Vaginal Care °· If you have an episiotomy or a vaginal tear, check the area every day for signs of infection. Check for: °? More redness, swelling, or pain. °? More fluid or blood. °? Warmth. °? Pus or a bad smell. °· Do not use tampons or douches until your health care provider says this is safe. °· Watch for any blood clots that may pass from your vagina. These may look like clumps of dark red, brown, or black discharge. °General instructions °· Keep your perineum clean and dry as told by your health care provider. °· Wear loose, comfortable clothing. °· Wipe from front to back when you use the toilet. °· Ask your health care provider if you can shower or take a bath. If you had an episiotomy or a perineal tear during labor and delivery, your health care provider may tell you not to take baths for a certain length of time. °· Wear a bra that supports your breasts and fits you well. °· If possible, have someone help you with household activities and help care for your baby for at least a few days after you leave the hospital. °· Keep all follow-up visits for you and your baby as told by your health care provider. This is important. °Contact a health care provider if: °· You have: °? Vaginal discharge that has a bad smell. °? Difficulty urinating. °? Pain when urinating. °? A sudden  increase or decrease in the frequency of your bowel movements. °? More redness, swelling, or pain around your episiotomy or vaginal tear. °? More fluid or blood coming from your episiotomy or vaginal tear. °? Pus or a bad smell coming from your episiotomy or vaginal tear. °? A fever. °? A rash. °? Little or no interest in activities you used to enjoy. °? Questions about caring for yourself or your baby. °· Your episiotomy or vaginal tear feels warm to the touch. °· Your episiotomy or vaginal tear is separating or does not appear to be healing. °· Your breasts are painful, hard, or turn red. °· You feel unusually sad or worried. °· You feel nauseous or you vomit. °· You pass large blood clots from your vagina. If you pass a blood clot from your vagina, save it to show to your health care provider. Do not flush blood clots down the toilet   without having your health care provider look at them. °· You urinate more than usual. °· You are dizzy or light-headed. °· You have not breastfed at all and you have not had a menstrual period for 12 weeks after delivery. °· You have stopped breastfeeding and you have not had a menstrual period for 12 weeks after you stopped breastfeeding. °Get help right away if: °· You have: °? Pain that does not go away or does not get better with medicine. °? Chest pain. °? Difficulty breathing. °? Blurred vision or spots in your vision. °? Thoughts about hurting yourself or your baby. °· You develop pain in your abdomen or in one of your legs. °· You develop a severe headache. °· You faint. °· You bleed from your vagina so much that you fill two sanitary pads in one hour. °This information is not intended to replace advice given to you by your health care provider. Make sure you discuss any questions you have with your health care provider. °Document Released: 04/04/2000 Document Revised: 09/19/2015 Document Reviewed: 04/22/2015 °Elsevier Interactive Patient Education © 2019 Elsevier Inc. ° °

## 2018-08-28 NOTE — Lactation Note (Signed)
This note was copied from a baby's chart. Lactation Consultation Note  Patient Name: Kristine Oconnell [id number] Date: 08/28/2018 Reason for consult: Follow-up assessment;Term  Asked by RN to assist with positioning and latching of term baby at 48 hrs old.  Baby at 3% weight loss.  Baby cueing swaddled in crib. Baby positioned on both breast in cross cradle hold.  Mom taught importance of U hold while baby in cross cradle.  Colostrum expressed easily from breast.  Baby takes a while to open wide enough, and once she does, a chin tug is needed to uncurl lower lip.  Mom denies feeling any pain with latch, and Mom feeling uterine cramping during the feeding.  Encouraged STS and feeding baby often with cues.  Reassured Mom that baby would be more hungry today, and recommended she keep her STS so she can identify her cueing earlier.  Goal of >8 feedings per 24 hrs.  Mom to ask for help prn.    Feeding Feeding Type: Breast Fed  LATCH Score Latch: Repeated attempts needed to sustain latch, nipple held in mouth throughout feeding, stimulation needed to elicit sucking reflex.  Audible Swallowing: A few with stimulation  Type of Nipple: Everted at rest and after stimulation  Comfort (Breast/Nipple): Soft / non-tender  Hold (Positioning): Assistance needed to correctly position infant at breast and maintain latch.  LATCH Score: 7  Interventions Interventions: Breast feeding basics reviewed;Assisted with latch;Skin to skin;Breast massage;Hand express;Support pillows;Adjust position;Breast compression;Hand pump;Position options  Lactation Tools Discussed/Used Tools: Pump Breast pump type: Manual   Consult Status Consult Status: Follow-up Date: 08/29/18 Follow-up type: In-patient    Judee Clara 08/28/2018, 10:31 AM

## 2018-08-30 ENCOUNTER — Telehealth: Payer: Self-pay | Admitting: General Practice

## 2018-08-30 NOTE — Telephone Encounter (Signed)
Left message on VM in regards to PP appt on 10/07/18 at 0930.  Asked pt to give our office a call.

## 2018-09-02 ENCOUNTER — Encounter: Payer: Medicaid Other | Admitting: Obstetrics and Gynecology

## 2018-10-07 ENCOUNTER — Telehealth (INDEPENDENT_AMBULATORY_CARE_PROVIDER_SITE_OTHER): Payer: Medicaid Other | Admitting: Obstetrics and Gynecology

## 2018-10-07 DIAGNOSIS — Z1389 Encounter for screening for other disorder: Secondary | ICD-10-CM

## 2018-10-07 NOTE — Progress Notes (Signed)
     TELEHEALTH VIRTUAL POSTPARTUM VISIT ENCOUNTER NOTE  I connected with [name] on 11/26/18 at  9:30 AM EDT by telephone at home and verified that I am speaking with the correct person using two identifiers.   I discussed the limitations, risks, security and privacy concerns of performing an evaluation and management service by telephone and the availability of in person appointments. I also discussed with the patient that there may be a patient responsible charge related to this service. The patient expressed understanding and agreed to proceed.   History:  Kristine Oconnell is a 28 y.o. [phone] female who presents for a postpartum visit. She is 6 weeks postpartum following a vaginal delivery. I have fully reviewed the prenatal and intrapartum course. The delivery was at 39.2 gestational weeks.  Anesthesia: epidural. Postpartum course has been uncomplicated. Baby's course has been uncomplicated. Baby is feeding by breastfeeding. Bleeding slightly, staining of pink. Bowel function is slightly normal, hard stool. Bladder function is normal. Patient is currently sexually active. Contraception method choice is Nexplanon. Postpartum depression screening: negative: 0.        Past Medical History:  Diagnosis Date  . Hernia, inguinal   . Supervision of other normal pregnancy, antepartum 04/12/2018    Nursing Staff Provider Office Location  [facility] Dating  Ultrasound 02/08/18 Language  English Anatomy US  Nml Flu Vaccine  Declined Genetic Screen  NIPS:   AFP:   First Screen:  Quad:   TDaP vaccine   Declined Hgb A1C or  GTT Early  Third trimester Normal  Rhogam     LAB RESULTS  Feeding Plan Breast? Blood Type O/Positive/-- (12/23 1430)  Contraception BTL? Antibody Negative (12/23 1430) Circ  . Uterine contractions during pregnancy 08/26/2018    The following portions of the patient's history were reviewed and updated as appropriate: allergies, current medications, past family history, past medical  history, past social history, past surgical history and problem list.   Health Maintenance:  Normal pap on 08/13/2016.  Review of Systems:  Pertinent items noted in HPI and remainder of comprehensive ROS otherwise negative.  Physical Exam:  Physical exam deferred due to nature of the encounter  Assessment and Plan:   Encounter for Postpartum visit of lactating mother Normal PP visit No R/F PPD Desires Nexplanon for contraceptive  will schedule ASAP   I discussed the assessment and treatment plan with the patient. The patient was provided an opportunity to ask questions and all were answered. The patient agreed with the plan and demonstrated an understanding of the instructions.   The patient was advised to call back or seek an in-person evaluation/go to the ED if the symptoms worsen or if the condition fails to improve as anticipated.  I provided 10 minutes of non-face-to-face time during this encounter. There was 5 minutes of chart review time spent prior to this encounter. Total time spent = 15 minutes.    Laury Deep, [place] for [place]

## 2018-10-14 ENCOUNTER — Other Ambulatory Visit: Payer: Self-pay

## 2018-10-14 ENCOUNTER — Encounter: Payer: Self-pay | Admitting: Obstetrics and Gynecology

## 2018-10-14 ENCOUNTER — Ambulatory Visit (INDEPENDENT_AMBULATORY_CARE_PROVIDER_SITE_OTHER): Payer: Medicaid Other | Admitting: Obstetrics and Gynecology

## 2018-10-14 VITALS — BP 137/85 | HR 99 | Temp 97.2°F | Wt 208.0 lb

## 2018-10-14 DIAGNOSIS — Z3202 Encounter for pregnancy test, result negative: Secondary | ICD-10-CM

## 2018-10-14 DIAGNOSIS — Z3040 Encounter for surveillance of contraceptives, unspecified: Secondary | ICD-10-CM

## 2018-10-14 LAB — POCT URINE PREGNANCY: Preg Test, Ur: NEGATIVE

## 2018-10-14 NOTE — Addendum Note (Signed)
Addended by: Graceann Congress on: 10/14/2018 04:38 PM   Modules accepted: Level of Service

## 2018-10-14 NOTE — Progress Notes (Signed)
   Patient in clinic for Nexplanon placement. Last sex intercourse was 10/13/2018 unprotected. Patient advised to return in 2 weeks,  No sex.  Derl Barrow, RN

## 2018-10-28 ENCOUNTER — Other Ambulatory Visit: Payer: Self-pay | Admitting: Obstetrics and Gynecology

## 2018-10-28 ENCOUNTER — Ambulatory Visit: Payer: Medicaid Other | Admitting: Obstetrics and Gynecology

## 2018-10-28 DIAGNOSIS — Z30011 Encounter for initial prescription of contraceptive pills: Secondary | ICD-10-CM

## 2018-10-28 MED ORDER — NORETHINDRONE 0.35 MG PO TABS: 1.0000 | ORAL_TABLET | Freq: Every day | ORAL | 1 refills | Status: DC

## 2018-10-28 NOTE — Progress Notes (Signed)
Patient called requesting Rx for pills until she can get in here for her Nexplanon insertion, because she missed her appt this morning. Rx sent for Micronor.  Laury Deep, CNM

## 2018-11-25 ENCOUNTER — Other Ambulatory Visit: Payer: Self-pay

## 2018-11-25 ENCOUNTER — Ambulatory Visit (INDEPENDENT_AMBULATORY_CARE_PROVIDER_SITE_OTHER): Payer: Medicaid Other | Admitting: Obstetrics and Gynecology

## 2018-11-25 ENCOUNTER — Encounter: Payer: Self-pay | Admitting: General Practice

## 2018-11-25 VITALS — BP 148/99 | HR 78 | Temp 98.5°F | Wt 215.6 lb

## 2018-11-25 DIAGNOSIS — Z30017 Encounter for initial prescription of implantable subdermal contraceptive: Secondary | ICD-10-CM | POA: Diagnosis not present

## 2018-11-25 DIAGNOSIS — Z3202 Encounter for pregnancy test, result negative: Secondary | ICD-10-CM | POA: Diagnosis not present

## 2018-11-25 LAB — POCT URINE PREGNANCY: Preg Test, Ur: NEGATIVE

## 2018-11-25 MED ORDER — ETONOGESTREL 68 MG ~~LOC~~ IMPL
68.0000 mg | DRUG_IMPLANT | Freq: Once | SUBCUTANEOUS | Status: AC
  Administered 2018-11-25: 68 mg via SUBCUTANEOUS

## 2018-11-25 NOTE — Progress Notes (Signed)
     GYNECOLOGY OFFICE PROCEDURE NOTE  Kristine Oconnell is a 28 y.o. [phone] here for Nexplanon insertion.  Last pap smear was on 4/18 and was normal.  No other gynecologic concerns.  Nexplanon Insertion Procedure Patient identified, informed consent performed, consent signed.   Patient does understand that irregular bleeding is a very common side effect of this medication. She was advised to have backup contraception for one week after placement. Pregnancy test in clinic today was negative.  Appropriate time out taken.  Patient's left arm was prepped and draped in the usual sterile fashion. The ruler used to measure and mark insertion area.  Patient was prepped with alcohol swab and then injected with 3 ml of 1% lidocaine.  She was prepped with betadine, Nexplanon removed from packaging,  Device confirmed in needle, then inserted full length of needle and withdrawn per handbook instructions. Nexplanon was able to palpated in the patient's arm; patient palpated the insert herself. There was minimal blood loss.  Patient insertion site covered with guaze and a pressure bandage to reduce any bruising.  The patient tolerated the procedure well and was given post procedure instructions.      [name], Artist Pais, [place] for [employer], [place]

## 2018-11-25 NOTE — Addendum Note (Signed)
Addended by: Derl Barrow on: 11/25/2018 03:24 PM   Modules accepted: Orders

## 2019-06-20 ENCOUNTER — Telehealth (INDEPENDENT_AMBULATORY_CARE_PROVIDER_SITE_OTHER): Payer: Medicaid Other | Admitting: Primary Care

## 2019-08-02 ENCOUNTER — Encounter: Payer: Self-pay | Admitting: General Practice

## 2019-10-17 ENCOUNTER — Other Ambulatory Visit: Payer: Self-pay

## 2020-04-17 ENCOUNTER — Encounter (HOSPITAL_COMMUNITY): Payer: Self-pay

## 2020-04-17 ENCOUNTER — Other Ambulatory Visit: Payer: Self-pay

## 2020-04-17 ENCOUNTER — Emergency Department (HOSPITAL_COMMUNITY)
Admission: EM | Admit: 2020-04-17 | Discharge: 2020-04-18 | Disposition: A | Discharge status: Home / Self Care | Payer: Medicaid Other | Attending: Emergency Medicine | Admitting: Emergency Medicine

## 2020-04-17 DIAGNOSIS — G43909 Migraine, unspecified, not intractable, without status migrainosus: Secondary | ICD-10-CM | POA: Insufficient documentation

## 2020-04-17 DIAGNOSIS — Z0279 Encounter for issue of other medical certificate: Secondary | ICD-10-CM | POA: Diagnosis not present

## 2020-04-17 DIAGNOSIS — R110 Nausea: Secondary | ICD-10-CM | POA: Diagnosis not present

## 2020-04-17 DIAGNOSIS — Z5321 Procedure and treatment not carried out due to patient leaving prior to being seen by health care provider: Secondary | ICD-10-CM | POA: Diagnosis not present

## 2020-04-17 DIAGNOSIS — R197 Diarrhea, unspecified: Secondary | ICD-10-CM | POA: Insufficient documentation

## 2020-04-17 DIAGNOSIS — R519 Headache, unspecified: Secondary | ICD-10-CM | POA: Diagnosis present

## 2020-04-17 LAB — COMPREHENSIVE METABOLIC PANEL
ALT: 16 U/L (ref 0–44)
AST: 17 U/L (ref 15–41)
Albumin: 4 g/dL (ref 3.5–5.0)
Alkaline Phosphatase: 64 U/L (ref 38–126)
Anion gap: 11 (ref 5–15)
BUN: 6 mg/dL (ref 6–20)
CO2: 24 mmol/L (ref 22–32)
Calcium: 8.7 mg/dL — ABNORMAL LOW (ref 8.9–10.3)
Chloride: 102 mmol/L (ref 98–111)
Creatinine, Ser: 1.01 mg/dL — ABNORMAL HIGH (ref 0.44–1.00)
GFR, Estimated: >60 mL/min (ref >60)
Glucose, Bld: 95 mg/dL (ref 70–99)
Potassium: 3.1 mmol/L — ABNORMAL LOW (ref 3.5–5.1)
Sodium: 137 mmol/L (ref 135–145)
Total Bilirubin: 0.5 mg/dL (ref 0.3–1.2)
Total Protein: 7.1 g/dL (ref 6.5–8.1)

## 2020-04-17 LAB — CBC
HCT: 38.2 % (ref 36.0–46.0)
Hemoglobin: 12.9 g/dL (ref 12.0–15.0)
MCH: 28.7 pg (ref 26.0–34.0)
MCHC: 33.8 g/dL (ref 30.0–36.0)
MCV: 84.9 fL (ref 80.0–100.0)
Platelets: 163 10*3/uL (ref 150–400)
RBC: 4.5 MIL/uL (ref 3.87–5.11)
RDW: 13.2 % (ref 11.5–15.5)
WBC: 2.7 10*3/uL — ABNORMAL LOW (ref 4.0–10.5)
nRBC: 0 % (ref 0.0–0.2)

## 2020-04-17 LAB — RAPID URINE DRUG SCREEN, HOSP PERFORMED
Amphetamines: NOT DETECTED
Barbiturates: NOT DETECTED
Benzodiazepines: NOT DETECTED
Cocaine: NOT DETECTED
Opiates: NOT DETECTED
Tetrahydrocannabinol: POSITIVE — AB

## 2020-04-17 LAB — I-STAT BETA HCG BLOOD, ED (MC, WL, AP ONLY): I-stat hCG, quantitative: <5 m[IU]/mL (ref <5)

## 2020-04-17 LAB — ACETAMINOPHEN LEVEL: Acetaminophen (Tylenol), Serum: <10 ug/mL — ABNORMAL LOW (ref 10–30)

## 2020-04-17 LAB — SALICYLATE LEVEL: Salicylate Lvl: <7 mg/dL — ABNORMAL LOW (ref 7.0–30.0)

## 2020-04-17 LAB — ETHANOL: Alcohol, Ethyl (B): <10 mg/dL (ref <10)

## 2020-04-17 MED ORDER — ACETAMINOPHEN 325 MG PO TABS
650.0000 mg | ORAL_TABLET | Freq: Once | ORAL | Status: AC
  Administered 2020-04-17: 650 mg via ORAL
  Filled 2020-04-17: qty 2

## 2020-04-17 NOTE — ED Triage Notes (Signed)
Pt presents with headache x3 days, yesterday pt reports feeling nauseous, headache, diarrhea on day two (Monday) that has since resolved because she's afraid to eat. Pt states she kept herself in her home where it was a calm area. Pt also reports she was dx with HTN in 2018, prescribed HTN medication (unknown) but refused to take the medication. Pt also states she was trying to get assistance with anxiety and depression today but was unsuccessful. Pt denies SI/HI.  Pt would like to wait to talk about new stressors until she gets in a room. Pt very tearful in triage.

## 2020-04-17 NOTE — ED Notes (Signed)
Pt said she could not wait and needed to leave.

## 2020-09-26 IMAGING — US US OB COMP LESS 14 WK
1 series · 15 of 24 positions shown · non-contrast
Comparison: None.

CLINICAL DATA: Pregnant

EXAM:
OBSTETRIC <14 WK ULTRASOUND
TECHNIQUE: Transabdominal ultrasound was performed for evaluation of the
gestation as well as the maternal uterus and adnexal regions.

[Series 1: us ob comp less 14 wk · 15 of 24 slices shown]
[im 1/24]
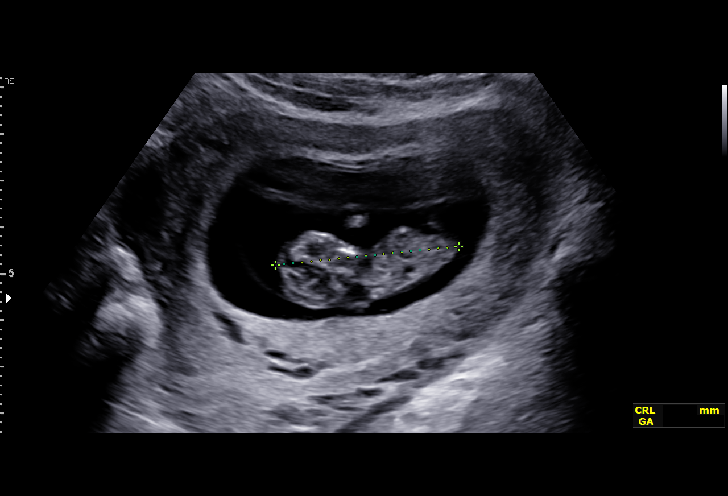
[im 3/24]
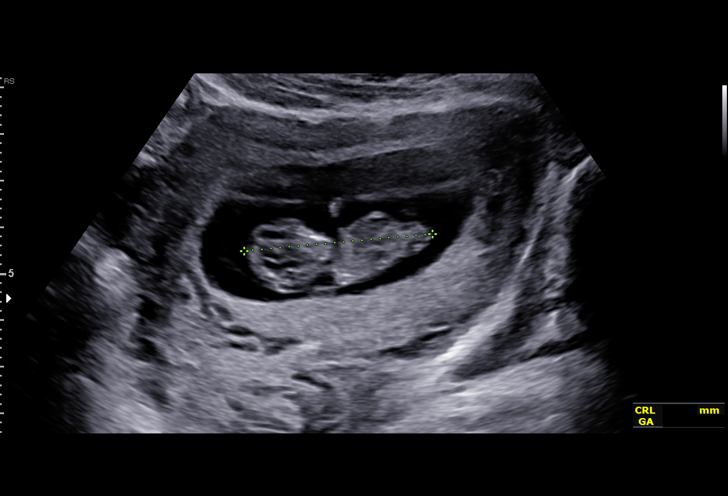
[im 5/24]
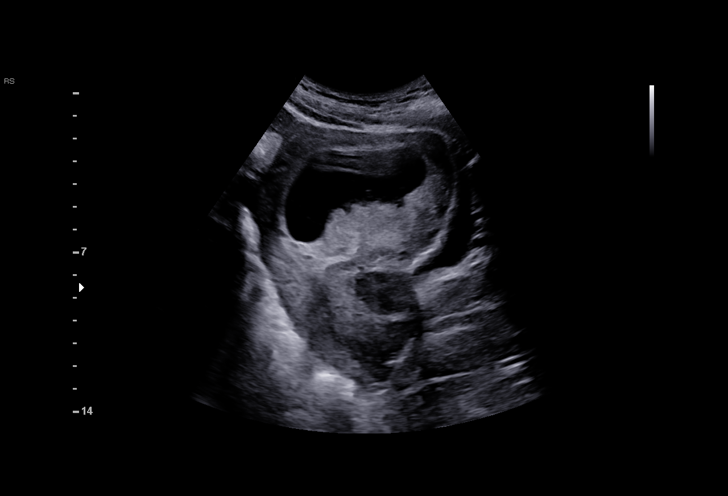
[im 6/24]
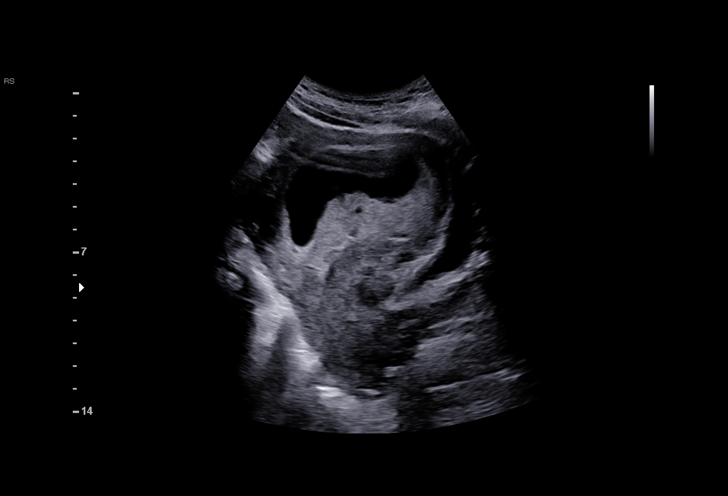
[im 8/24]
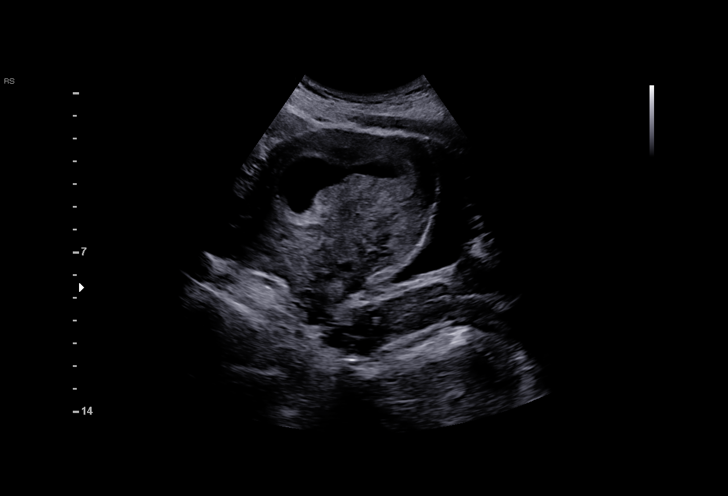
[im 9/24]
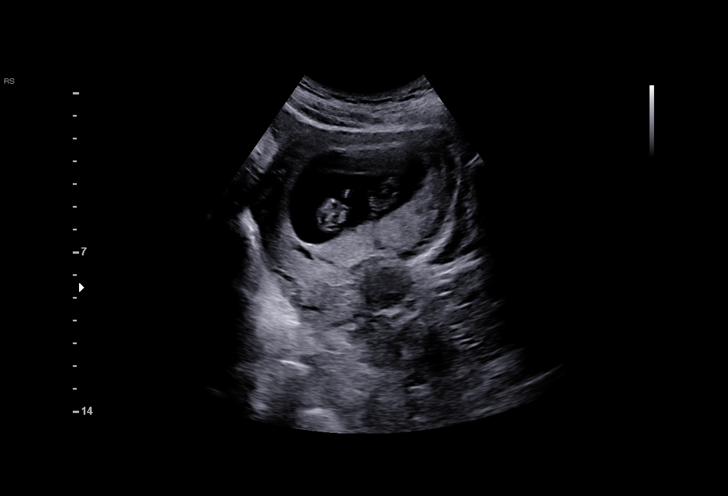
[im 11/24]
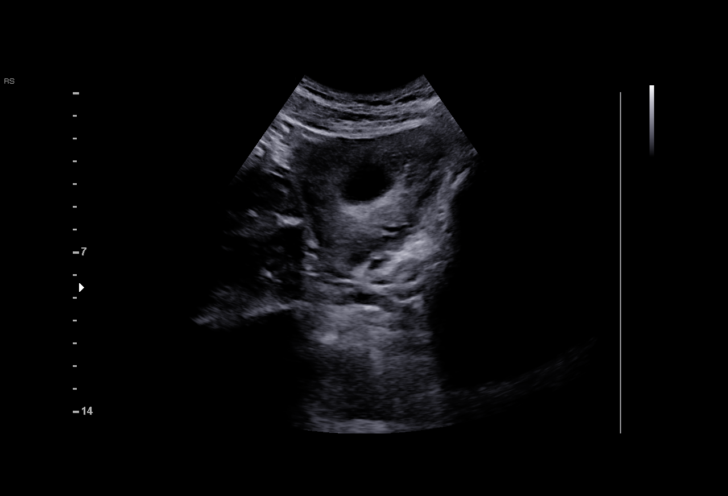
[im 13/24]
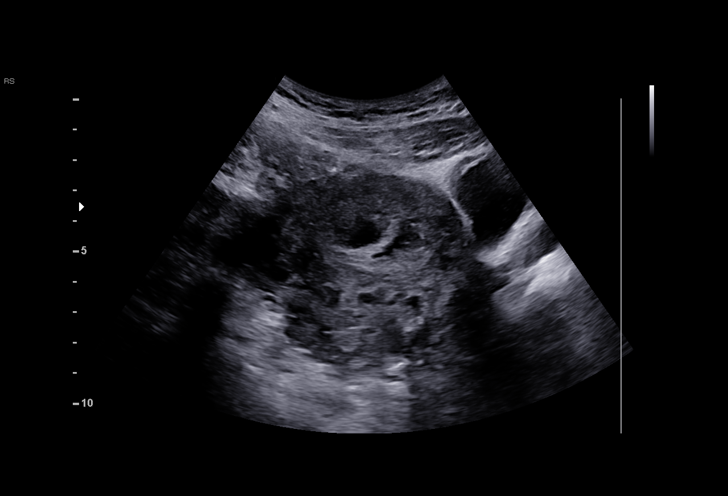
[im 14/24]
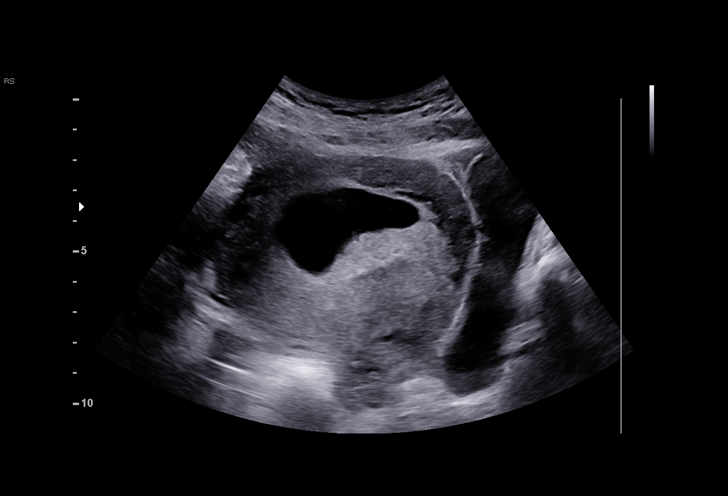
[im 16/24]
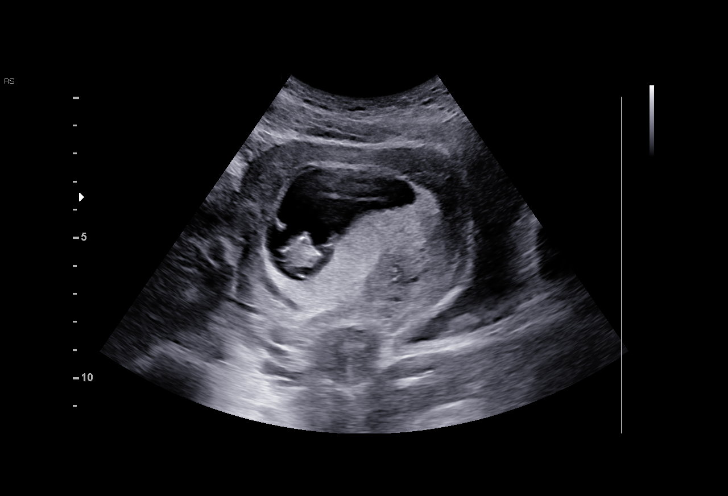
[im 17/24]
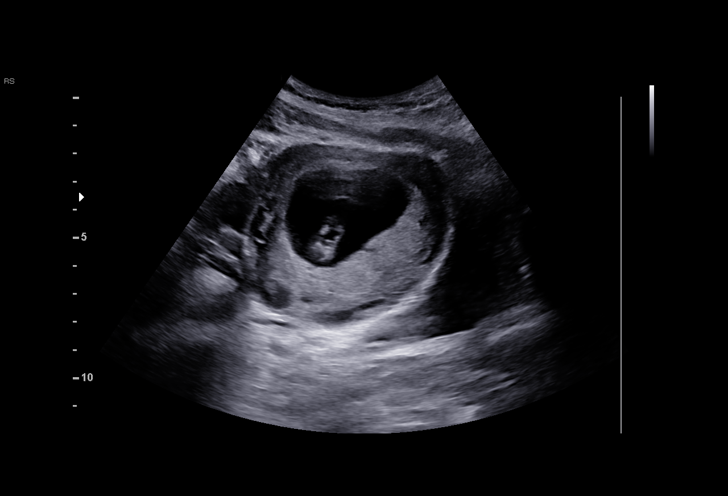
[im 19/24]
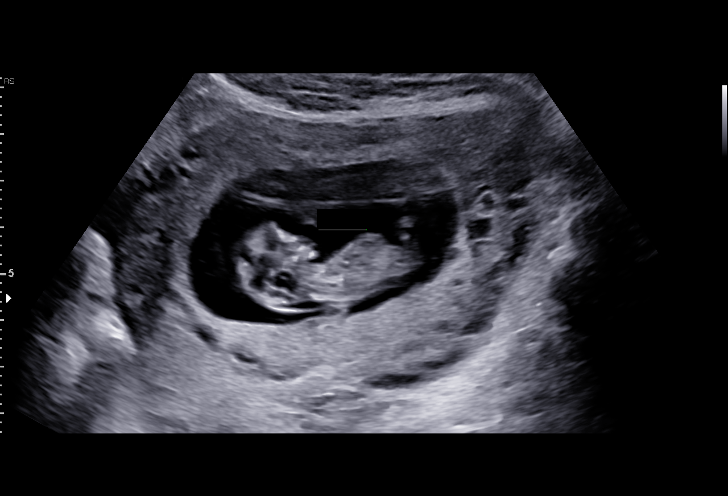
[im 21/24]
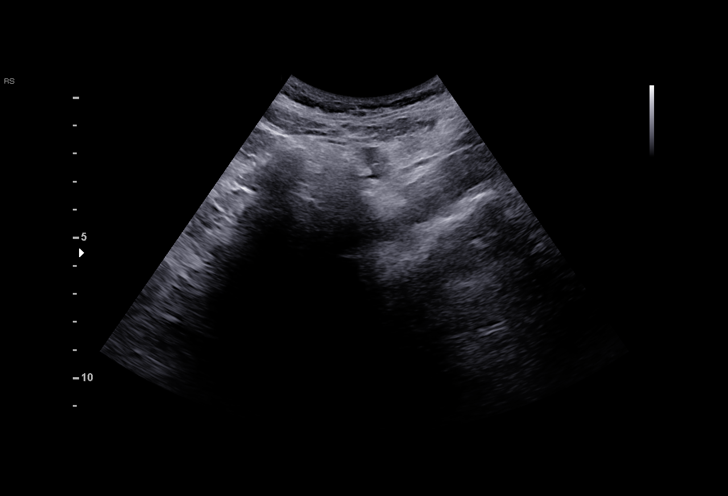
[im 22/24]
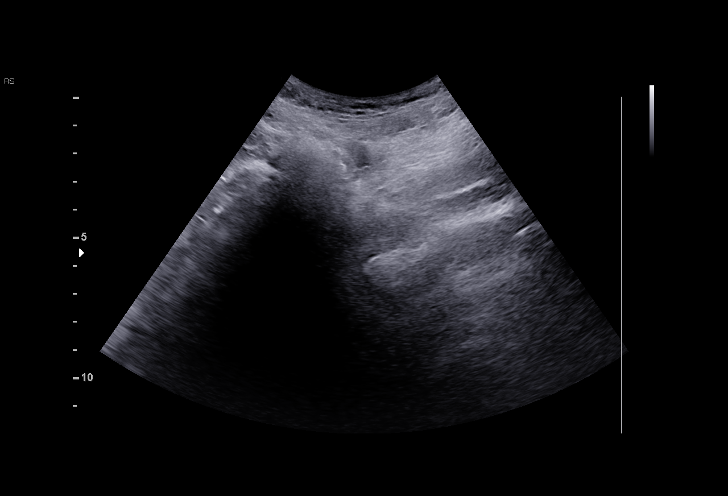
[im 24/24]
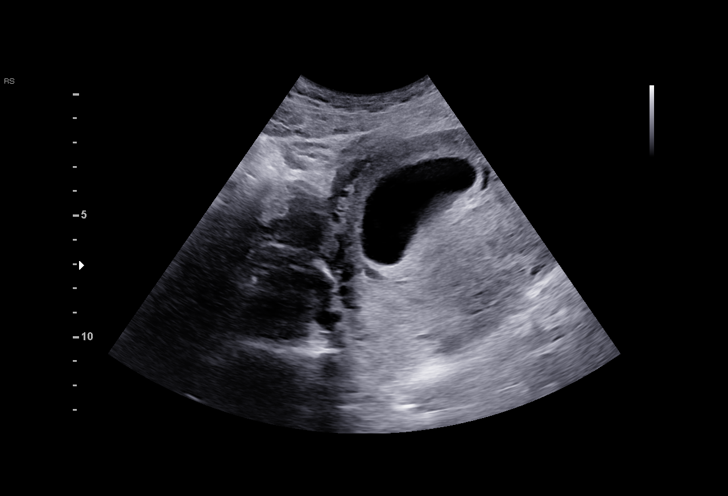

[15 of 24 positions shown; findings below may reference images not displayed]

FINDINGS: Intrauterine gestational sac: Single

Yolk sac:  Not Visualized.

Embryo:  Visualized.

Cardiac Activity: Visualized.

Heart Rate: 169 bpm

CRL:   39.9 mm   10 w 6 d                  US EDC: 08/31/2018

Subchorionic hemorrhage:  None visualized.

Maternal uterus/adnexae: Dominant 2.9 cm subserosal fibroid in the
left lower uterine segment and 2.5 cm subserosal fibroid in the
uterine fundus.

Bilateral ovaries are not discretely visualized due to overlying
bowel gas.

No free fluid.
IMPRESSION: Single live intrauterine gestation, with estimated gestational age
10 weeks 6 days by crown-rump length, as above.

## 2021-07-11 ENCOUNTER — Other Ambulatory Visit: Payer: Self-pay

## 2021-07-11 ENCOUNTER — Other Ambulatory Visit (HOSPITAL_COMMUNITY)
Admission: RE | Admit: 2021-07-11 | Discharge: 2021-07-11 | Disposition: A | Discharge status: Home / Self Care | Payer: Medicaid Other | Source: Ambulatory Visit | Attending: Student | Admitting: Student

## 2021-07-11 ENCOUNTER — Ambulatory Visit (INDEPENDENT_AMBULATORY_CARE_PROVIDER_SITE_OTHER): Payer: Medicaid Other | Admitting: Student

## 2021-07-11 ENCOUNTER — Encounter (INDEPENDENT_AMBULATORY_CARE_PROVIDER_SITE_OTHER): Payer: Self-pay | Admitting: Primary Care

## 2021-07-11 ENCOUNTER — Ambulatory Visit (INDEPENDENT_AMBULATORY_CARE_PROVIDER_SITE_OTHER): Payer: Medicaid Other | Admitting: Primary Care

## 2021-07-11 ENCOUNTER — Encounter: Payer: Self-pay | Admitting: Student

## 2021-07-11 VITALS — BP 159/104 | HR 95 | Wt 211.5 lb

## 2021-07-11 VITALS — BP 132/94 | HR 83 | Temp 98.4°F | Ht 70.0 in | Wt 212.0 lb

## 2021-07-11 DIAGNOSIS — Z124 Encounter for screening for malignant neoplasm of cervix: Secondary | ICD-10-CM | POA: Insufficient documentation

## 2021-07-11 DIAGNOSIS — Z7689 Persons encountering health services in other specified circumstances: Secondary | ICD-10-CM | POA: Diagnosis not present

## 2021-07-11 DIAGNOSIS — I10 Essential (primary) hypertension: Secondary | ICD-10-CM

## 2021-07-11 DIAGNOSIS — N946 Dysmenorrhea, unspecified: Secondary | ICD-10-CM

## 2021-07-11 DIAGNOSIS — Z01419 Encounter for gynecological examination (general) (routine) without abnormal findings: Secondary | ICD-10-CM | POA: Diagnosis not present

## 2021-07-11 MED ORDER — AMLODIPINE BESYLATE 10 MG PO TABS: 10.0000 mg | ORAL_TABLET | Freq: Every day | ORAL | 1 refills | Status: DC

## 2021-07-11 MED ORDER — HYDROCHLOROTHIAZIDE 12.5 MG PO TABS: 25.0000 mg | ORAL_TABLET | Freq: Every day | ORAL | 1 refills | Status: DC

## 2021-07-11 NOTE — Patient Instructions (Signed)

## 2021-07-11 NOTE — Progress Notes (Signed)
?  History:  ?[name] is a 31 y.o. [phone] who presents to clinic today for "mass in her vagina that comes during her period". She reports normal periods. She is due to start her period this week. She denies any pain with intercourse. She reports that the mass feels smooth and thick; "like an old tampon" and that it does not hurt or bleed. She has not been having intercourse with her partner because of this. She denies any pain during intercourse.  ? ?She has elevated BP and is not taking medicine. Says she was taking medicine but stopped because "it was bad for her kidneys".  ? ?The following portions of the patient's history were reviewed and updated as appropriate: allergies, current medications, family history, past medical history, social history, past surgical history and problem list. ? ?Review of Systems:  ?Review of Systems  ?Constitutional: Negative.   ?HENT: Negative.    ?Respiratory: Negative.    ?Cardiovascular: Negative.   ?Genitourinary: Negative.   ?Musculoskeletal: Negative.   ?Skin: Negative.   ?Neurological: Negative.   ?Psychiatric/Behavioral: Negative.    ? ?  ?Objective:  ?Physical Exam ?BP (!) 159/104   Pulse 95   Wt 211 lb 8 oz (95.9 kg)   BMI 31.23 kg/m?  ?Physical Exam ?Constitutional:   ?   Appearance: Normal appearance.  ?Pulmonary:  ?   Effort: Pulmonary effort is normal.  ?Abdominal:  ?   General: Abdomen is flat.  ?Genitourinary: ?   General: Normal vulva.  ?   Comments: NEFG; no masses, lumps or discharge noted, cervix is pink and smooth, no lesions on vaginal walls.  ?Musculoskeletal:     ?   General: Normal range of motion.  ?Skin: ?   General: Skin is warm.  ?Neurological:  ?   General: No focal deficit present.  ?   Mental Status: She is alert.  ?Psychiatric:     ?   Mood and Affect: Mood normal.  ? ?Breasts: soft, non-tender, no discharge, no lumps or masses.  ? ? ?Labs and Imaging ?No results found for this or any previous visit (from the past 24 hour(s)). ? ?No  results found. ? ?Health Maintenance Due  ?Topic Date Due  ? COVID-19 Vaccine (1) Never done  ? Hepatitis C Screening  Never done  ? TETANUS/TDAP  Never done  ? PAP SMEAR-Modifier  08/14/2019  ? INFLUENZA VACCINE  Never done  ? ? ? ?Assessment & Plan:  ?1. Papanicolaou smear for cervical cancer screening ?-Normal GYN exam; discussed normalcy of cervical changes during menses and post-childbirth; she is reassured.  ?-she does not want any blood drawn today for STI but she would like testing off of her pap smear ?-breast exam is benign today ?- Cytology - PAP( [facility]) ?-she will make appt for nexplanon removal in August ?-patient needs to start care with [facility] for BP issues; I emphasized the importance of being seen. She would like to go to the clinic on [facility]. Referral placed and patient will call.  ? ?Approximately 30 minutes of total time was spent with this patient on review of labs, PA and exam.  ? ?Marylene Land, CNM ?07/11/2021 ?9:10 AM ? ?

## 2021-07-11 NOTE — Progress Notes (Signed)
Mass in vagina that moves up and down during menstrual cycle. Noticed for the first time in January. Also had a similar experience when she was 31 years old, but the mass fell out. Has not had intercourse since last month. No discomfort associated with intercourse or otherwise.  ? Fleet Contras RN ?07/11/21 ?

## 2021-07-14 NOTE — Progress Notes (Signed)
? ?New Patient Office Visit ? ?Subjective:  ?Patient ID: Kristine Oconnell, female    DOB: 12/30/1990  Age: 31 y.o. MRN: [id number] ? ?CC:  ?Chief Complaint  ?Patient presents with  ? New Patient (Initial Visit)  ?  Bp concerns. Had ObGYN appt today and her Bp was elevated 154/109  ? ? ?HPI ?Kristine Oconnell is a 32 year old female who presents for establishment of care.  She is concerned about elevated blood pressure GYN encouraged her to find a primary care to manage her blood pressure.  Today she denies shortness of breath, headaches, chest pain or lower extremity edema  ? ?Past Medical History:  ?Diagnosis Date  ? Hernia, inguinal   ? Supervision of other normal pregnancy, antepartum 04/12/2018  ?  Nursing Staff Provider Office Location  [facility] Dating  Ultrasound 02/08/18 Language  English Anatomy US  Nml Flu Vaccine  Declined Genetic Screen  NIPS:   AFP:   First Screen:  Quad:   TDaP vaccine   Declined Hgb A1C or  GTT Early  Third trimester Normal  Rhogam     LAB RESULTS  Feeding Plan Breast? Blood Type O/Positive/-- (12/23 1430)  Contraception BTL? Antibody Negative (12/23 1430) Circ  ? Uterine contractions during pregnancy 08/26/2018  ? ? ?Past Surgical History:  ?Procedure Laterality Date  ? HERNIA REPAIR    ? ? ?Family History  ?Problem Relation Age of Onset  ? Hypertension Mother   ? Diabetes Maternal Aunt   ? Hypertension Maternal Aunt   ? Cancer Maternal Grandmother   ?     breast  ? Cancer Maternal Grandfather   ? Stroke Maternal Grandfather   ? ? ?Social History  ? ?Socioeconomic History  ? Marital status: Single  ?  Spouse name: Not on file  ? Number of children: 1  ? Years of education: Not on file  ? Highest education level: Some college, no degree  ?Occupational History  ? Not on file  ?Tobacco Use  ? Smoking status: Former  ? Smokeless tobacco: Never  ? Tobacco comments:  ?  passive smoke exposure  ?Vaping Use  ? Vaping Use: Never used  ?Substance and Sexual Activity  ? Alcohol use: Not Currently  ?   Comment: occ  ? Drug use: No  ? Sexual activity: Yes  ?  Birth control/protection: None  ?Other Topics Concern  ? Not on file  ?Social History Narrative  ? Not on file  ? ?Social Determinants of Health  ? ?Financial Resource Strain: Not on file  ?Food Insecurity: Food Insecurity Present  ? Worried About Charity fundraiser in the Last Year: Sometimes true  ? Ran Out of Food in the Last Year: Sometimes true  ?Transportation Needs: No Transportation Needs  ? Lack of Transportation (Medical): No  ? Lack of Transportation (Non-Medical): No  ?Physical Activity: Not on file  ?Stress: Not on file  ?Social Connections: Not on file  ?Intimate Partner Violence: Not on file  ? ? ?ROS ?Comprehensive ROS Pertinent positive and negative noted in HPI   ?Objective:  ? ?Today's Vitals: BP (!) 132/94 (BP Location: Right Arm, Patient Position: Sitting, Cuff Size: Large)   Pulse 83   Temp 98.4 ?F (36.9 ?C) (Oral)   Ht _0  (1.778 m)   Wt 212 lb (96.2 kg)   LMP 06/17/2021 (Approximate)   SpO2 95%   BMI 30.42 kg/m?  ? ?Physical Exam ?Vitals reviewed.  ?Constitutional:   ?   Appearance: She is normal  weight.  ?HENT:  ?   Head: Normocephalic.  ?   Right Ear: Tympanic membrane and external ear normal.  ?   Left Ear: Tympanic membrane and external ear normal.  ?Cardiovascular:  ?   Rate and Rhythm: Normal rate and regular rhythm.  ?Pulmonary:  ?   Effort: Pulmonary effort is normal.  ?   Breath sounds: Normal breath sounds.  ?Abdominal:  ?   General: Bowel sounds are normal.  ?   Palpations: Abdomen is soft.  ?Musculoskeletal:     ?   General: Normal range of motion.  ?   Cervical back: Normal range of motion.  ?Skin: ?   General: Skin is warm and dry.  ?Neurological:  ?   Mental Status: She is alert and oriented to person, place, and time.  ?Psychiatric:     ?   Mood and Affect: Mood normal.     ?   Behavior: Behavior normal.     ?   Thought Content: Thought content normal.     ?   Judgment: Judgment normal.  ? ?Assessment & Plan:   ?Kristine Oconnell was seen today for new patient (initial visit). ? ?Diagnoses and all orders for this visit: ? ?Dysmenorrhea ?-     CBC with Differential/Platelet; Future ? ?Encounter to establish care ?Establish care with new PCP ? ?Essential hypertension ?BP goal - < 130/80 ?Explained that having normal blood pressure is the goal and medications are helping to get to goal and maintain normal blood pressure. ?DIET: Limit salt intake, read nutrition labels to check salt content, limit fried and high fatty foods  ?Avoid using multisymptom OTC cold preparations that generally contain sudafed which can rise BP. Consult with pharmacist on best cold relief products to use for persons with HTN ?EXERCISE ?Discussed incorporating exercise such as walking - 30 minutes most days of the week and can do in 10 minute intervals.  Started on amlodipine 10 daily and HCTZ 12.5 daily ?-     CMP14+EGFR; Future ? ?Other orders ?-     amLODipine (NORVASC) 10 MG tablet; Take 1 tablet (10 mg total) by mouth daily. ?-     hydrochlorothiazide (HYDRODIURIL) 12.5 MG tablet; Take 2 tablets (25 mg total) by mouth daily. ? ?  ? ? ?Outpatient Encounter Medications as of 07/11/2021  ?Medication Sig  ? amLODipine (NORVASC) 10 MG tablet Take 1 tablet (10 mg total) by mouth daily.  ? hydrochlorothiazide (HYDRODIURIL) 12.5 MG tablet Take 2 tablets (25 mg total) by mouth daily.  ? [DISCONTINUED] Pediatric Multivit-Minerals-C (MULTIVITAMINS PEDIATRIC PO) Take by mouth.  ? ?No facility-administered encounter medications on file as of 07/11/2021.  ? ? ?Follow-up: Return in about 6 weeks (around 08/22/2021) for BP ck.  ? ?Kerin Perna, NP ? ?

## 2021-07-15 ENCOUNTER — Encounter: Payer: Self-pay | Admitting: Student

## 2021-07-15 DIAGNOSIS — A599 Trichomoniasis, unspecified: Secondary | ICD-10-CM

## 2021-07-15 LAB — CYTOLOGY - PAP
Adequacy: ABSENT
Chlamydia: NEGATIVE
Comment: NEGATIVE
Comment: NEGATIVE
Comment: NEGATIVE
Comment: NORMAL
Diagnosis: NEGATIVE
High risk HPV: NEGATIVE
Neisseria Gonorrhea: NEGATIVE
Trichomonas: POSITIVE — AB

## 2021-07-15 MED ORDER — METRONIDAZOLE 500 MG PO TABS: 500.0000 mg | ORAL_TABLET | Freq: Two times a day (BID) | ORAL | 0 refills | Status: DC

## 2021-07-16 ENCOUNTER — Other Ambulatory Visit: Payer: Self-pay | Admitting: Student

## 2021-07-16 DIAGNOSIS — A599 Trichomoniasis, unspecified: Secondary | ICD-10-CM | POA: Insufficient documentation

## 2021-08-07 ENCOUNTER — Ambulatory Visit (INDEPENDENT_AMBULATORY_CARE_PROVIDER_SITE_OTHER): Payer: Medicaid Other | Admitting: Primary Care

## 2021-08-22 ENCOUNTER — Encounter (INDEPENDENT_AMBULATORY_CARE_PROVIDER_SITE_OTHER): Payer: Self-pay | Admitting: Primary Care

## 2021-08-22 ENCOUNTER — Ambulatory Visit (INDEPENDENT_AMBULATORY_CARE_PROVIDER_SITE_OTHER): Payer: Medicaid Other | Admitting: Primary Care

## 2021-08-22 DIAGNOSIS — N946 Dysmenorrhea, unspecified: Secondary | ICD-10-CM | POA: Diagnosis not present

## 2021-08-22 DIAGNOSIS — I10 Essential (primary) hypertension: Secondary | ICD-10-CM

## 2021-08-22 NOTE — Patient Instructions (Signed)
Deep Vein Thrombosis  Deep vein thrombosis (DVT) is a condition in which a blood clot forms in a vein of the deep venous system. This can occur in the lower leg, thigh, pelvis, arm, or neck. A clot is blood that has thickened into a gel or solid. This condition is serious and can be life-threatening if the clot travels to the arteries of the lungs and causes a blockage (pulmonary embolism). A DVT can also damage veins in the leg, which can lead to long-term venous disease, leg pain, swelling, discoloration, and ulcers or sores (post-thrombotic syndrome). What are the causes? This condition may be caused by: A slowdown of blood flow. Damage to a vein. A condition that causes blood to clot more easily, such as certain bleeding disorders. What increases the risk? The following factors may make you more likely to develop this condition: Obesity. Being older, especially older than age 60. Being inactive or not moving around (sedentary lifestyle). This may include: Sitting or lying down for longer than 4-6 hours other than to sleep at night. Being in the hospital, or having major or lengthy surgery. Having any recent bone injuries, such as breaks (fractures), that reduce movement, especially in the lower extremities. Having recent orthopedic surgery on the lower extremities. Being pregnant, giving birth, or having recently given birth. Taking medicines that contain estrogen, such as birth control or hormone replacement therapy. Using products that contain nicotine or tobacco, especially if you use hormonal birth control. Having a history of a blood vessel disease (peripheral vascular disease) or congestive heart disease. Having a history of cancer, especially if being treated with chemotherapy. What are the signs or symptoms? Symptoms of this condition include: Swelling, pain, pressure, or tenderness in an arm or a leg. An arm or a leg becoming warm, red, or discolored. A leg turning very pale or  blue. You may have a large DVT. This is rare. If the clot is in your leg, you may notice that symptoms get worse when you stand or walk. In some cases, there are no symptoms. How is this diagnosed? This condition is diagnosed with: Your medical history and a physical exam. Tests, such as: Blood tests to check how well your blood clots. Doppler ultrasound. This is the best way to find a DVT. CT venogram. Contrast dye is injected into a vein, and X-rays are taken to check for clots. This is helpful for veins in the chest or pelvis. How is this treated? Treatment for this condition depends on: The cause of your DVT. The size and location of your DVT, or having more than one DVT. Your risk for bleeding or developing more clots. Other medical conditions you may have. Treatment may include: Taking a blood thinner medicine (anticoagulant) to prevent more clots from forming or current clots from growing. Wearing compression stockings. Injecting medicines into the affected vein to break up the clot (catheter-directed thrombolysis). Surgical procedures, when DVT is severe or hard to treat. These may be done to: Isolate and remove your clot. Place an inferior vena cava (IVC) filter. This filter is placed into a large vein called the inferior vena cava to catch blood clots before they reach your lungs. You may get some medical treatments for 6 months or longer. Follow these instructions at home: If you are taking blood thinners: Talk with your health care provider before you take any medicines that contain aspirin or NSAIDs, such as ibuprofen. These medicines increase your risk for dangerous bleeding. Take your medicine exactly   as told, at the same time every day. Do not skip a dose. Do not take more than the prescribed dose. This is important. Ask your health care provider about foods and medicines that could change or interact with the way your blood thinner works. Avoid these foods and medicines  if you are told to do so. Avoid anything that may cause bleeding or bruising. You may bleed more easily while taking blood thinners. Be very careful when using knives, scissors, or other sharp objects. Use an electric razor instead of a blade. Avoid activities that could cause injury or bruising, and follow instructions for preventing falls. Tell your health care provider if you have had any internal bleeding, bleeding ulcers, or neurologic diseases, such as strokes or cerebral aneurysms. Wear a medical alert bracelet or carry a card that lists what medicines you take. General instructions Take over-the-counter and prescription medicines only as told by your health care provider. Return to your normal activities as told by your health care provider. Ask your health care provider what activities are safe for you. If recommended, wear compression stockings as told by your health care provider. These stockings help to prevent blood clots and reduce swelling in your legs. Never wear your compression stockings while sleeping at night. Keep all follow-up visits. This is important. Where to find more information American Heart Association: www.heart.org Centers for Disease Control and Prevention: www.cdc.gov National Heart, Lung, and Blood Institute: www.nhlbi.nih.gov Contact a health care provider if: You miss a dose of your blood thinner. You have unusual bruising or other color changes. You have new or worse pain, swelling, or redness in an arm or a leg. You have worsening numbness or tingling in an arm or a leg. You have a significant color change (pale or blue) in the extremity that has the DVT. Get help right away if: You have signs or symptoms that a blood clot has moved to the lungs. These may include: Shortness of breath. Chest pain. Fast or irregular heartbeats (palpitations). Light-headedness, dizziness, or fainting. Coughing up blood. You have signs or symptoms that your blood is  too thin. These may include: Blood in your vomit, stool, or urine. A cut that will not stop bleeding. A menstrual period that is heavier than usual. A severe headache or confusion. These symptoms may be an emergency. Get help right away. Call 911. Do not wait to see if the symptoms will go away. Do not drive yourself to the hospital. Summary Deep vein thrombosis (DVT) happens when a blood clot forms in a deep vein. This may occur in the lower leg, thigh, pelvis, arm, or neck. Symptoms affect the arm or leg and can include swelling, pain, tenderness, warmth, redness, or discoloration. This condition may be treated with medicines. In severe cases, a procedure or surgery may be done to remove or dissolve the clots. If you are taking blood thinners, take them exactly as told. Do not skip a dose. Do not take more than is prescribed. Get help right away if you have a severe headache, shortness of breath, chest pain, fast or irregular heartbeats, or blood in your vomit, urine, or stool. This information is not intended to replace advice given to you by your health care provider. Make sure you discuss any questions you have with your health care provider. Document Revised: 10/29/2020 Document Reviewed: 10/29/2020 Elsevier Patient Education  2023 Elsevier Inc.  

## 2021-08-22 NOTE — Progress Notes (Signed)
?[facility] ? ? ?Kristine Oconnell is a 31 y.o. female presents for hypertension evaluation, Denies shortness of breath, headaches, chest pain or lower extremity edema, sudden onset, vision changes, unilateral weakness, dizziness, paresthesias  ? ?Patient reports adherence with medications. ? ?Dietary habits include: monitoring sodium intake  ?Exercise habits include:walking  ?Family / Social history: mother HTN maternal grandfather CVA ? ? ?Past Medical History:  ?Diagnosis Date  ? Hernia, inguinal   ? Supervision of other normal pregnancy, antepartum 04/12/2018  ?  Nursing Staff Provider Office Location  [facility] Dating  Ultrasound 02/08/18 Language  English Anatomy US  Nml Flu Vaccine  Declined Genetic Screen  NIPS:   AFP:   First Screen:  Quad:   TDaP vaccine   Declined Hgb A1C or  GTT Early  Third trimester Normal  Rhogam     LAB RESULTS  Feeding Plan Breast? Blood Type O/Positive/-- (12/23 1430)  Contraception BTL? Antibody Negative (12/23 1430) Circ  ? Uterine contractions during pregnancy 08/26/2018  ? ?Past Surgical History:  ?Procedure Laterality Date  ? HERNIA REPAIR    ? ?Allergies  ?Allergen Reactions  ? Sulfa Antibiotics Rash  ? ?Current Outpatient Medications on File Prior to Visit  ?Medication Sig Dispense Refill  ? amLODipine (NORVASC) 10 MG tablet Take 1 tablet (10 mg total) by mouth daily. 90 tablet 1  ? hydrochlorothiazide (HYDRODIURIL) 12.5 MG tablet Take 2 tablets (25 mg total) by mouth daily. 90 tablet 1  ? metroNIDAZOLE (FLAGYL) 500 MG tablet Take 1 tablet (500 mg total) by mouth 2 (two) times daily. 14 tablet 0  ? ?No current facility-administered medications on file prior to visit.  ? ?Social History  ? ?Socioeconomic History  ? Marital status: Single  ?  Spouse name: Not on file  ? Number of children: 1  ? Years of education: Not on file  ? Highest education level: Some college, no degree  ?Occupational History  ? Not on file  ?Tobacco Use  ? Smoking status: Former  ?  Smokeless tobacco: Never  ? Tobacco comments:  ?  passive smoke exposure  ?Vaping Use  ? Vaping Use: Never used  ?Substance and Sexual Activity  ? Alcohol use: Not Currently  ?  Comment: occ  ? Drug use: No  ? Sexual activity: Yes  ?  Birth control/protection: None  ?Other Topics Concern  ? Not on file  ?Social History Narrative  ? Not on file  ? ?Social Determinants of Health  ? ?Financial Resource Strain: Not on file  ?Food Insecurity: Food Insecurity Present  ? Worried About Charity fundraiser in the Last Year: Sometimes true  ? Ran Out of Food in the Last Year: Sometimes true  ?Transportation Needs: No Transportation Needs  ? Lack of Transportation (Medical): No  ? Lack of Transportation (Non-Medical): No  ?Physical Activity: Not on file  ?Stress: Not on file  ?Social Connections: Not on file  ?Intimate Partner Violence: Not on file  ? ?Family History  ?Problem Relation Age of Onset  ? Hypertension Mother   ? Diabetes Maternal Aunt   ? Hypertension Maternal Aunt   ? Cancer Maternal Grandmother   ?     breast  ? Cancer Maternal Grandfather   ? Stroke Maternal Grandfather   ? ? ? ?OBJECTIVE: ? ?There were no vitals filed for this visit. ? ?Physical Exam ?Physical exam: ?General: Vital signs reviewed.  Patient is well-developed and well-nourished, obese female in no acute distress and cooperative with exam. ?  Head: Normocephalic and atraumatic. ?Eyes: EOMI, conjunctivae normal, no scleral icterus. ?Neck: Supple, trachea midline, normal ROM, no JVD, masses, thyromegaly, or carotid bruit present. ?Cardiovascular: RRR, S1 normal, S2 normal, no murmurs, gallops, or rubs. ?Pulmonary/Chest: Clear to auscultation bilaterally, no wheezes, rales, or rhonchi. ?Abdominal: Soft, non-tender, non-distended, BS +, no masses, organomegaly, or guarding present. ?Musculoskeletal: No joint deformities, erythema, or stiffness, ROM full and nontender. ?Extremities: No lower extremity edema bilaterally,  pulses symmetric and intact  bilaterally. No cyanosis or clubbing. ?Neurological: A&O x3, Strength is normal ?Skin: Warm, dry and intact. No rashes or erythema. ?Psychiatric: Normal mood and affect. speech and behavior is normal. Cognition and memory are normal. ?   ? ?ROS ?Comprehensive ROS Pertinent positive and negative noted in HPI   ?Last 3 Office BP readings: ?BP Readings from Last 3 Encounters:  ?07/11/21 (!) 132/94  ?07/11/21 (!) 159/104  ?04/17/20 (!) 163/107  ? ? ?BMET ?   ?Component Value Date/Time  ? NA 137 04/17/2020 1859  ? NA 138 06/17/2018 0842  ? K 3.1 (L) 04/17/2020 1859  ? CL 102 04/17/2020 1859  ? CO2 24 04/17/2020 1859  ? GLUCOSE 95 04/17/2020 1859  ? BUN 6 04/17/2020 1859  ? BUN 5 (L) 06/17/2018 7124  ? CREATININE 1.01 (H) 04/17/2020 1859  ? CALCIUM 8.7 (L) 04/17/2020 1859  ? GFRNONAA >60 04/17/2020 1859  ? GFRAA 146 06/17/2018 0842  ? ? ?Renal function: ?CrCl cannot be calculated (Patient's most recent lab result is older than the maximum 21 days allowed.). ? ?Clinical ASCVD: No  ?The ASCVD Risk score (Arnett DK, et al., 2019) failed to calculate for the following reasons: ?  The 2019 ASCVD risk score is only valid for ages 32 to 16 ? ?ASCVD risk factors include- [place] ? ? ?ASSESSMENT & PLAN: ?Diagnoses and all orders for this visit: ? ?Essential hypertension ?-     CMP14+EGFR ?-Counseled on lifestyle modifications for blood pressure control including reduced dietary sodium, increased exercise, weight reduction and adequate sleep. Also, educated patient about the risk for cardiovascular events, stroke and heart attack. Also counseled patient about the importance of medication adherence. If you participate in smoking, it is important to stop using tobacco as this will increase the risks associated with uncontrolled blood pressure.  ? ? ?Dysmenorrhea ?-     CBC with Differential/Platelet ? ? ? ?This note has been created with Surveyor, quantity. Any transcriptional errors are  unintentional.  ? ?Kerin Perna, NP ?08/22/2021, 1:35 PM ?  ?

## 2021-09-09 LAB — CMP14+EGFR
ALT: 12 IU/L (ref 0–32)
AST: 11 IU/L (ref 0–40)
Albumin/Globulin Ratio: 1.4 (ref 1.2–2.2)
Albumin: 5 g/dL — ABNORMAL HIGH (ref 3.8–4.8)
Alkaline Phosphatase: 87 IU/L (ref 44–121)
BUN/Creatinine Ratio: 11 (ref 9–23)
BUN: 9 mg/dL (ref 6–20)
Bilirubin Total: 0.2 mg/dL (ref 0.0–1.2)
CO2: 26 mmol/L (ref 20–29)
Calcium: 10 mg/dL (ref 8.7–10.2)
Chloride: 97 mmol/L (ref 96–106)
Creatinine, Ser: 0.84 mg/dL (ref 0.57–1.00)
Globulin, Total: 3.5 g/dL (ref 1.5–4.5)
Glucose: 86 mg/dL (ref 70–99)
Potassium: 3.6 mmol/L (ref 3.5–5.2)
Sodium: 141 mmol/L (ref 134–144)
Total Protein: 8.5 g/dL (ref 6.0–8.5)
eGFR: 95 mL/min/{1.73_m2} (ref >59)

## 2021-09-09 LAB — CBC WITH DIFFERENTIAL/PLATELET

## 2021-09-17 ENCOUNTER — Ambulatory Visit (INDEPENDENT_AMBULATORY_CARE_PROVIDER_SITE_OTHER): Payer: Medicaid Other | Admitting: Primary Care

## 2021-09-17 ENCOUNTER — Other Ambulatory Visit (HOSPITAL_COMMUNITY)
Admission: RE | Admit: 2021-09-17 | Discharge: 2021-09-17 | Disposition: A | Discharge status: Home / Self Care | Payer: Medicaid Other | Source: Ambulatory Visit | Attending: Primary Care | Admitting: Primary Care

## 2021-09-17 ENCOUNTER — Encounter (INDEPENDENT_AMBULATORY_CARE_PROVIDER_SITE_OTHER): Payer: Self-pay | Admitting: Primary Care

## 2021-09-17 VITALS — BP 110/75 | HR 99 | Temp 97.8°F | Ht 70.0 in | Wt 220.0 lb

## 2021-09-17 DIAGNOSIS — Z113 Encounter for screening for infections with a predominantly sexual mode of transmission: Secondary | ICD-10-CM | POA: Insufficient documentation

## 2021-09-17 NOTE — Progress Notes (Signed)
[facility]  History of Present Illness   Patient Identification Kristine Oconnell is a 31 y.o. female.  Patient information was obtained from patient. History/Exam limitations: none.  Chief Complaint  STD testing   Patient presents with a complaint of vaginal discharge and odor with sharp abdominal pain which has subsided . Onset of symptoms was  7 days ago, and has been unchanged since that time. The patient describes the discharge as normal and physiologic and does experience abdominal discomfort with the discharge. She does not complain of burning with urination. She denies genital lesions at this time. The patient does  have a history of STD's or PID and denies multiple sexual partners. She is not homosexual. The patient is HIV negative. Patient has No headache, No chest pain, - No Nausea, No new weakness tingling or numbness, No Cough - shortness of breath  Past Medical History:  Diagnosis Date   Hernia, inguinal    Supervision of other normal pregnancy, antepartum 04/12/2018    Nursing Staff Provider Office Location  [facility] Dating  Ultrasound 02/08/18 Language  English Anatomy US  Nml Flu Vaccine  Declined Genetic Screen  NIPS:   AFP:   First Screen:  Quad:   TDaP vaccine   Declined Hgb A1C or  GTT Early  Third trimester Normal  Rhogam     LAB RESULTS  Feeding Plan Breast? Blood Type O/Positive/-- (12/23 1430)  Contraception BTL? Antibody Negative (12/23 1430) Circ   Uterine contractions during pregnancy 08/26/2018   Family History  Problem Relation Age of Onset   Hypertension Mother    Diabetes Maternal Aunt    Hypertension Maternal Aunt    Cancer Maternal Grandmother        breast   Cancer Maternal Grandfather    Stroke Maternal Grandfather    Allergies  Allergen Reactions   Sulfa Antibiotics Rash   Social History   Socioeconomic History   Marital status: Single    Spouse name: Not on file   Number of children: 1   Years of education: Not on  file   Highest education level: Some college, no degree  Occupational History   Not on file  Tobacco Use   Smoking status: Former   Smokeless tobacco: Never   Tobacco comments:    passive smoke exposure  Vaping Use   Vaping Use: Never used  Substance and Sexual Activity   Alcohol use: Not Currently    Comment: occ   Drug use: No   Sexual activity: Yes    Birth control/protection: None  Other Topics Concern   Not on file  Social History Narrative   Not on file   Social Determinants of Health   Financial Resource Strain: Not on file  Food Insecurity: Food Insecurity Present   Worried About Running Out of Food in the Last Year: Sometimes true   Ran Out of Food in the Last Year: Sometimes true  Transportation Needs: No Transportation Needs   Lack of Transportation (Medical): No   Lack of Transportation (Non-Medical): No  Physical Activity: Not on file  Stress: Not on file  Social Connections: Not on file  Intimate Partner Violence: Not on file   Review of Systems Pertinent items noted in HPI and remainder of comprehensive ROS otherwise negative.   Physical Exam  General: No apparent distress. Eyes: Extraocular eye movements intact, pupils equal and round. Neck: Supple, trachea midline. Thyroid: No enlargement, mobile without fixation, no tenderness. Cardiovascular: Regular rhythm and rate,  no murmur, normal radial pulses. Respiratory: Normal respiratory effort, clear to auscultation. Gastrointestinal: Normal pitch active bowel sounds, nontender abdomen without distention or appreciable hepatomegaly. Skin: Appropriate warmth, no visible rash. Mental status: Alert, conversant, speech clear, thought logical, appropriate mood and affect, no hallucinations or delusions evident. Hematologic/lymphatic: No cervical adenopathy, no visible ecchymoses.  BP 110/75   Pulse 99   Temp 97.8 F (36.6 C) (Oral)   Ht 5\' 10"  (1.778 m)   Wt 220 lb (99.8 kg)   SpO2 97%   BMI 31.57  kg/m   Kristine Oconnell was seen today for std testing.  Diagnoses and all orders for this visit:   Screening examination for STD (sexually transmitted disease) -     Cervicovaginal ancillary only  This note has been created with Alcario Drought. Any transcriptional errors are unintentional.   Education officer, environmental, NP 09/17/2021, 1:48 PM

## 2021-09-19 ENCOUNTER — Other Ambulatory Visit (INDEPENDENT_AMBULATORY_CARE_PROVIDER_SITE_OTHER): Payer: Self-pay | Admitting: Primary Care

## 2021-09-19 LAB — CERVICOVAGINAL ANCILLARY ONLY
Bacterial Vaginitis (gardnerella): POSITIVE — AB
Candida Glabrata: NEGATIVE
Candida Vaginitis: NEGATIVE
Chlamydia: NEGATIVE
Comment: NEGATIVE
Comment: NEGATIVE
Comment: NEGATIVE
Comment: NEGATIVE
Comment: NEGATIVE
Comment: NORMAL
Neisseria Gonorrhea: NEGATIVE
Trichomonas: POSITIVE — AB

## 2021-09-19 MED ORDER — METRONIDAZOLE 500 MG PO TABS: 500.0000 mg | ORAL_TABLET | Freq: Two times a day (BID) | ORAL | 0 refills | Status: DC

## 2021-09-19 MED ORDER — FLUCONAZOLE 150 MG PO TABS: 150.0000 mg | ORAL_TABLET | Freq: Every day | ORAL | 1 refills | Status: DC

## 2021-10-02 ENCOUNTER — Other Ambulatory Visit (INDEPENDENT_AMBULATORY_CARE_PROVIDER_SITE_OTHER): Payer: Self-pay | Admitting: Primary Care

## 2021-11-04 ENCOUNTER — Ambulatory Visit (INDEPENDENT_AMBULATORY_CARE_PROVIDER_SITE_OTHER): Payer: Medicaid Other | Admitting: Primary Care

## 2021-11-08 DIAGNOSIS — Z113 Encounter for screening for infections with a predominantly sexual mode of transmission: Secondary | ICD-10-CM | POA: Diagnosis not present

## 2021-11-08 DIAGNOSIS — A5901 Trichomonal vulvovaginitis: Secondary | ICD-10-CM | POA: Diagnosis not present

## 2022-02-18 ENCOUNTER — Ambulatory Visit (INDEPENDENT_AMBULATORY_CARE_PROVIDER_SITE_OTHER): Payer: Medicaid Other | Admitting: Obstetrics and Gynecology

## 2022-02-18 ENCOUNTER — Encounter: Payer: Self-pay | Admitting: Obstetrics and Gynecology

## 2022-02-18 VITALS — BP 139/96 | HR 87 | Ht 69.0 in | Wt 216.4 lb

## 2022-02-18 DIAGNOSIS — Z3046 Encounter for surveillance of implantable subdermal contraceptive: Secondary | ICD-10-CM | POA: Diagnosis not present

## 2022-02-18 DIAGNOSIS — Z30017 Encounter for initial prescription of implantable subdermal contraceptive: Secondary | ICD-10-CM | POA: Diagnosis not present

## 2022-02-18 LAB — POCT PREGNANCY, URINE: Preg Test, Ur: NEGATIVE

## 2022-02-18 MED ORDER — ETONOGESTREL 68 MG ~~LOC~~ IMPL
68.0000 mg | DRUG_IMPLANT | Freq: Once | SUBCUTANEOUS | Status: AC
  Administered 2022-02-18: 68 mg via SUBCUTANEOUS

## 2022-02-18 NOTE — Progress Notes (Signed)
GYNECOLOGY VISIT  Patient name: Kristine Oconnell MRN [id number]  Date of birth: January 23, 1991 Chief Complaint:   Nexplanon Removal    History:  Kristine Oconnell is a 31 y.o. [phone] being seen today for nexplanon removal & insertion. Due initially for removal in August. Has normal menses with nexplanon. Initially felt that maybe her mood was affected in the beginning but unsure.  Past Medical History:  Diagnosis Date   Hernia, inguinal    Supervision of other normal pregnancy, antepartum 04/12/2018    Nursing Staff Provider Office Location  [facility] Dating  Ultrasound 02/08/18 Language  English Anatomy US  Nml Flu Vaccine  Declined Genetic Screen  NIPS:   AFP:   First Screen:  Quad:   TDaP vaccine   Declined Hgb A1C or  GTT Early  Third trimester Normal  Rhogam     LAB RESULTS  Feeding Plan Breast? Blood Type O/Positive/-- (12/23 1430)  Contraception BTL? Antibody Negative (12/23 1430) Circ   Uterine contractions during pregnancy 08/26/2018    Past Surgical History:  Procedure Laterality Date   HERNIA REPAIR      The following portions of the patient's history were reviewed and updated as appropriate: allergies, current medications, past family history, past medical history, past social history, past surgical history and problem list.   Health Maintenance:   Last pap 06/2021. NILM, HRHPV    Review of Systems:  Pertinent items are noted in HPI. Comprehensive review of systems was otherwise negative.   Objective:  Physical Exam BP (!) 139/96   Pulse 87   Ht 5\' 9"  (1.753 m)   Wt 216 lb 6.4 oz (98.2 kg)   LMP 01/26/2022 (Approximate)   BMI 31.96 kg/m    Physical Exam Vitals and nursing note reviewed.  Constitutional:      Appearance: Normal appearance.  HENT:     Head: Normocephalic and atraumatic.  Cardiovascular:     Rate and Rhythm: Normal rate and regular rhythm.  Pulmonary:     Effort: Pulmonary effort is normal.     Breath sounds: Normal breath sounds.  Skin:     General: Skin is warm and dry.  Neurological:     General: No focal deficit present.     Mental Status: She is alert.  Psychiatric:        Mood and Affect: Mood normal.        Behavior: Behavior normal.        Thought Content: Thought content normal.        Judgment: Judgment normal.    Nexplanon Removal and Reinsertion Patient identified, informed consent performed, consent signed.   Patient does understand that irregular bleeding is a very common side effect of this medication. She was advised to have backup contraception for one week after replacement of the implant. Pregnancy test in clinic today was negative.  Appropriate time out taken. Nexplanon site identified in left arm.  Area prepped in usual sterile fashon. Two ml of 1% lidocaine was used to anesthetize the area at the distal end of the implant and along the intended insertion site. A small stab incision was made right beside the implant on the distal portion. The Nexplanon rod was retrieved with scalpel and removed without difficulty. There was minimal blood loss. Device confirmed in needle, then inserted full length of needle and withdrawn per handbook instructions. Nexplanon was able to palpated in the patient's arm; patient declined to palpate the insert herself.  There was minimal blood loss. Patient insertion  site covered with gauze, bandaid and a pressure bandage to reduce any bruising. The patient tolerated the procedure well and was given post procedure instructions.  She was advised to have backup contraception for one week.    Labs and Imaging Lab Results  Component Value Date   PREGTESTUR NEGATIVE 02/18/2022   HCG <5.0 04/17/2020       Assessment & Plan:   1. Encounter for removal and reinsertion of Nexplanon She is now s/p uncomplicated removal and reinsertion of nexplanon for contraception. Reviewed expectations and that if side effects not well tolerated can have removed at any time.   Routine preventative  health maintenance measures emphasized.  Lorriane Shire, MD [facility] for [facility], [facility]

## 2022-02-22 ENCOUNTER — Encounter (HOSPITAL_COMMUNITY): Payer: Self-pay

## 2022-02-22 ENCOUNTER — Ambulatory Visit (HOSPITAL_COMMUNITY)
Admission: RE | Admit: 2022-02-22 | Discharge: 2022-02-22 | Disposition: A | Discharge status: Home / Self Care | Payer: Medicaid Other | Source: Ambulatory Visit | Attending: Physician Assistant | Admitting: Physician Assistant

## 2022-02-22 VITALS — BP 161/88 | HR 92 | Temp 99.0°F | Resp 16

## 2022-02-22 DIAGNOSIS — Z202 Contact with and (suspected) exposure to infections with a predominantly sexual mode of transmission: Secondary | ICD-10-CM | POA: Diagnosis not present

## 2022-02-22 DIAGNOSIS — N898 Other specified noninflammatory disorders of vagina: Secondary | ICD-10-CM

## 2022-02-22 DIAGNOSIS — I10 Essential (primary) hypertension: Secondary | ICD-10-CM

## 2022-02-22 DIAGNOSIS — Z113 Encounter for screening for infections with a predominantly sexual mode of transmission: Secondary | ICD-10-CM

## 2022-02-22 HISTORY — DX: Essential (primary) hypertension: I10

## 2022-02-22 MED ORDER — METRONIDAZOLE 500 MG PO TABS: 500.0000 mg | ORAL_TABLET | Freq: Two times a day (BID) | ORAL | 0 refills | Status: DC

## 2022-02-22 MED ORDER — AMLODIPINE BESYLATE 10 MG PO TABS: 10.0000 mg | ORAL_TABLET | Freq: Every day | ORAL | 0 refills | Status: DC

## 2022-02-22 NOTE — ED Triage Notes (Signed)
C/O pruritic vaginal discharge onset 3 days ago. Denies abd pain. Was told she has been exposed to trich.

## 2022-02-22 NOTE — Discharge Instructions (Signed)
We are treating you for trichomonas.  Start metronidazole as prescribed.  Abstain from sex for 1 week after completing course of medication (a minimum of 2 weeks).  All partners need to be tested and treated as well as you can get this again.  Use a condom with each sexual encounter.  If you develop any additional symptoms please return for reevaluation.  We will contact you if need to arrange additional treatment.  Your blood pressure is elevated.  I have called in a refill of your amlodipine.  Start taking this daily.  Your goal blood pressure is 130/85.  Monitor this at home and if it is persistently above this level please return here or see your primary care for medication adjustment.  Avoid decongestants, caffeine, sodium.  Avoid NSAIDs including aspirin, ibuprofen/Advil, naproxen/Aleve.  If you develop any chest pain, shortness of breath, headache, vision change, dizziness in the setting of high blood pressure you need to go to the emergency room.

## 2022-02-22 NOTE — ED Provider Notes (Signed)
[facility]    CSN: [id number] Arrival date & time: 02/22/22  1233      History   Chief Complaint Chief Complaint  Patient presents with   Appt     1245   Exposure to STD   Vaginal Discharge    HPI Calliope Delangel is a 31 y.o. female.   Patient presents today with 3-day history of vaginal irritation and discharge.  She describes this as copious with yellow tint.  Reports that she has been exposed to trichomonas and is concerned that recurrence of this is causing her symptoms.  She denies any pelvic pain, abdominal pain, fever, nausea, vomiting.  She is confident she is not pregnant as she had her Nexplanon replaced recently and had a negative pregnancy test.  She denies any changes to medications or antibiotic use.  Denies changes to personal hygiene products including soaps or detergents.  Blood pressure is elevated today.  Patient is not currently taking blood pressure medication on a regular basis as her blood pressure had improved with decrease stressors.  Unfortunately, her stressors have increased again and she has noticed her blood pressures been elevated.  She denies any increase in sodium, caffeine, decongestant, NSAID consumption.  Denies any current chest pain, shortness of breath, headache, vision change, dizziness.  She is open to restarting medication.    Past Medical History:  Diagnosis Date   Hernia, inguinal    Hypertension    Supervision of other normal pregnancy, antepartum 04/12/2018    Nursing Staff Provider Office Location  [facility] Dating  Ultrasound 02/08/18 Language  English Anatomy US  Nml Flu Vaccine  Declined Genetic Screen  NIPS:   AFP:   First Screen:  Quad:   TDaP vaccine   Declined Hgb A1C or  GTT Early  Third trimester Normal  Rhogam     LAB RESULTS  Feeding Plan Breast? Blood Type O/Positive/-- (12/23 1430)  Contraception BTL? Antibody Negative (12/23 1430) Circ   Uterine contractions during pregnancy 08/26/2018    Patient Active  Problem List   Diagnosis Date Noted   Trichomoniasis 07/16/2021   Nexplanon insertion 11/25/2018   Postpartum hypertension 08/28/2018   Chorioamnionitis 08/27/2018   History of gestational hypertension 02/11/2017   Genital herpes affecting pregnancy, antepartum 02/02/2017   Tobacco smoking affecting pregnancy, antepartum 08/13/2016    Past Surgical History:  Procedure Laterality Date   HERNIA REPAIR      OB History     Gravida  3   Para  2   Term  2   Preterm      AB  1   Living  2      SAB  0   IAB  1   Ectopic      Multiple  0   Live Births  2            Home Medications    Prior to Admission medications   Medication Sig Start Date End Date Taking? Authorizing Provider  metroNIDAZOLE (FLAGYL) 500 MG tablet Take 1 tablet (500 mg total) by mouth 2 (two) times daily. 02/22/22  Yes Duane Earnshaw K, PA-C  amLODipine (NORVASC) 10 MG tablet Take 1 tablet (10 mg total) by mouth daily. 02/22/22   Ouida Abeyta, Noberto Retort, PA-C  hydrochlorothiazide (HYDRODIURIL) 12.5 MG tablet Take 2 tablets (25 mg total) by mouth daily. Patient not taking: Reported on 02/18/2022 07/11/21   Grayce Sessions, NP    Family History Family History  Problem Relation Age of Onset  Hypertension Mother    Diabetes Maternal Aunt    Hypertension Maternal Aunt    Cancer Maternal Grandmother        breast   Cancer Maternal Grandfather    Stroke Maternal Grandfather     Social History Social History   Tobacco Use   Smoking status: Every Day    Types: Cigarettes   Smokeless tobacco: Never   Tobacco comments:    passive smoke exposure  Vaping Use   Vaping Use: Never used  Substance Use Topics   Alcohol use: Not Currently   Drug use: Not Currently     Allergies   Sulfa antibiotics   Review of Systems Review of Systems  Constitutional:  Negative for activity change, appetite change, fatigue and fever.  Eyes:  Negative for visual disturbance.  Respiratory:  Negative for  shortness of breath.   Cardiovascular:  Negative for chest pain.  Gastrointestinal:  Negative for abdominal pain, diarrhea, nausea and vomiting.  Genitourinary:  Positive for vaginal discharge. Negative for dysuria, frequency, pelvic pain, urgency, vaginal bleeding and vaginal pain.  Neurological:  Negative for dizziness, light-headedness and headaches.     Physical Exam Triage Vital Signs ED Triage Vitals  Enc Vitals Group     BP 02/22/22 1242 (!) 161/88     Pulse Rate 02/22/22 1242 92     Resp 02/22/22 1242 16     Temp 02/22/22 1242 99 F (37.2 C)     Temp Source 02/22/22 1242 Oral     SpO2 02/22/22 1242 97 %     Weight --      Height --      Head Circumference --      Peak Flow --      Pain Score 02/22/22 1243 0     Pain Loc --      Pain Edu? --      Excl. in [place]? --    No data found.  Updated Vital Signs BP (!) 161/88 Comment: not taking her HTN med  Pulse 92   Temp 99 F (37.2 C) (Oral)   Resp 16   LMP 01/26/2022 (Approximate)   SpO2 97%   Visual Acuity Right Eye Distance:   Left Eye Distance:   Bilateral Distance:    Right Eye Near:   Left Eye Near:    Bilateral Near:     Physical Exam Vitals reviewed.  Constitutional:      General: She is awake. She is not in acute distress.    Appearance: Normal appearance. She is well-developed. She is not ill-appearing.     Comments: Very pleasant female appears stated age in no acute distress sitting comfortably in exam room  HENT:     Head: Normocephalic and atraumatic.  Cardiovascular:     Rate and Rhythm: Normal rate and regular rhythm.     Heart sounds: Normal heart sounds, S1 normal and S2 normal. No murmur heard. Pulmonary:     Effort: Pulmonary effort is normal.     Breath sounds: Normal breath sounds. No wheezing, rhonchi or rales.     Comments: Clear to auscultation bilaterally Abdominal:     General: Bowel sounds are normal.     Palpations: Abdomen is soft.     Tenderness: There is no abdominal  tenderness. There is no right CVA tenderness, left CVA tenderness, guarding or rebound.     Comments: Benign abdominal exam  Genitourinary:    Comments: Exam deferred Psychiatric:  Behavior: Behavior is cooperative.      UC Treatments / Results  Labs (all labs ordered are listed, but only abnormal results are displayed) Labs Reviewed  CERVICOVAGINAL ANCILLARY ONLY    EKG   Radiology No results found.  Procedures Procedures (including critical care time)  Medications Ordered in UC Medications - No data to display  Initial Impression / Assessment and Plan / UC Course  I have reviewed the triage vital signs and the nursing notes.  Pertinent labs & imaging results that were available during my care of the patient were reviewed by me and considered in my medical decision making (see chart for details).     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  Discussed that this is likely the cause of her symptoms given clinical presentation.  We will treat today but obtain STI swab and contact her if need to arrange any additional treatment.  Discussed that partners will need to be treated as she can get this again with additional exposure.  Discussed the importance of safe sex practices.  Offered HIV/hepatitis/syphilis testing but patient declined this today she has no concern.  Discussed that if she has any worsening or persistent symptoms she should return for reevaluation.  Discussed that she should abstain from sex while on metronidazole due to Antabuse side effects to which she expressed understanding.  Patient's blood pressure is elevated.  She reports that she has been out of her blood pressure medication.  She is open to restarting it.  Amlodipine 10 mg sent to pharmacy.  Recommended that she avoid decongestants, caffeine, sodium.  Discussed that her goal would be blood pressure consistently at 130/85 or lower.  We will need to consider additional  medication if this remains elevated despite amlodipine.  Discussed that if she develops any chest pain, shortness of breath, headache, vision change, dizziness in the setting of high blood pressure she needs to go to the emergency room.  She is to follow-up with primary care within a few weeks and if she cannot see them she is to return here for recheck.  Strict return precautions given.  Final Clinical Impressions(s) / UC Diagnoses   Final diagnoses:  Vaginal discharge  Exposure to STD  Exposure to trichomonas  Routine screening for STI (sexually transmitted infection)  Elevated blood pressure reading in office with diagnosis of hypertension     Discharge Instructions      We are treating you for trichomonas.  Start metronidazole as prescribed.  Abstain from sex for 1 week after completing course of medication (a minimum of 2 weeks).  All partners need to be tested and treated as well as you can get this again.  Use a condom with each sexual encounter.  If you develop any additional symptoms please return for reevaluation.  We will contact you if need to arrange additional treatment.  Your blood pressure is elevated.  I have called in a refill of your amlodipine.  Start taking this daily.  Your goal blood pressure is 130/85.  Monitor this at home and if it is persistently above this level please return here or see your primary care for medication adjustment.  Avoid decongestants, caffeine, sodium.  Avoid NSAIDs including aspirin, ibuprofen/Advil, naproxen/Aleve.  If you develop any chest pain, shortness of breath, headache, vision change, dizziness in the setting of high blood pressure you need to go to the emergency room.     ED Prescriptions     Medication Sig Dispense Auth.  Provider   amLODipine (NORVASC) 10 MG tablet Take 1 tablet (10 mg total) by mouth daily. 90 tablet Aneshia Jacquet K, PA-C   metroNIDAZOLE (FLAGYL) 500 MG tablet Take 1 tablet (500 mg total) by mouth 2 (two) times  daily. 14 tablet Madoc Holquin, Noberto Retort, PA-C      PDMP not reviewed this encounter.   Jeani Hawking, PA-C 02/22/22 1308

## 2022-02-24 ENCOUNTER — Encounter: Payer: Self-pay | Admitting: General Practice

## 2022-02-24 ENCOUNTER — Ambulatory Visit (INDEPENDENT_AMBULATORY_CARE_PROVIDER_SITE_OTHER): Payer: Medicaid Other | Admitting: Primary Care

## 2022-02-24 LAB — CERVICOVAGINAL ANCILLARY ONLY
Bacterial Vaginitis (gardnerella): POSITIVE — AB
Candida Glabrata: NEGATIVE
Candida Vaginitis: NEGATIVE
Chlamydia: NEGATIVE
Comment: NEGATIVE
Comment: NEGATIVE
Comment: NEGATIVE
Comment: NEGATIVE
Comment: NEGATIVE
Comment: NORMAL
Neisseria Gonorrhea: NEGATIVE
Trichomonas: POSITIVE — AB

## 2022-05-15 ENCOUNTER — Telehealth: Payer: Self-pay | Admitting: Family Medicine

## 2022-05-15 NOTE — Telephone Encounter (Signed)
Patient said she have a nexplanon and she said it's making her feel sick

## 2022-05-15 NOTE — Telephone Encounter (Signed)
Returned patients call. Patient did not answer. LM for her to call the office with further questions or concerns asked her to check her My Chart and to reply through My Chart or to return call to the office.

## 2022-06-20 ENCOUNTER — Ambulatory Visit: Payer: Medicaid Other | Admitting: Obstetrics & Gynecology

## 2023-03-02 ENCOUNTER — Ambulatory Visit (HOSPITAL_COMMUNITY): Payer: Self-pay

## 2023-07-20 ENCOUNTER — Ambulatory Visit: Payer: Medicaid Other | Admitting: Family Medicine

## 2023-07-20 ENCOUNTER — Other Ambulatory Visit: Payer: Self-pay

## 2023-07-20 ENCOUNTER — Encounter: Payer: Self-pay | Admitting: Family Medicine

## 2023-07-20 VITALS — BP 141/107 | HR 76 | Wt 242.0 lb

## 2023-07-20 DIAGNOSIS — I10 Essential (primary) hypertension: Secondary | ICD-10-CM | POA: Diagnosis not present

## 2023-07-20 DIAGNOSIS — Z3046 Encounter for surveillance of implantable subdermal contraceptive: Secondary | ICD-10-CM

## 2023-07-20 MED ORDER — HYDROCHLOROTHIAZIDE 12.5 MG PO TABS: 25.0000 mg | ORAL_TABLET | Freq: Every day | ORAL | 1 refills | Status: DC

## 2023-07-20 MED ORDER — AMLODIPINE BESYLATE 10 MG PO TABS: 10.0000 mg | ORAL_TABLET | Freq: Every day | ORAL | 0 refills | Status: DC

## 2023-07-20 NOTE — Progress Notes (Signed)
   Subjective:    Patient ID: Kristine Oconnell is a 33 y.o. female presenting with Nexplanon Removal  on 07/20/2023  HPI: Reports irregular bleeding with Nexplanon. Would like this removed. She is not currently sexually active. Declines other birth control at present. Supposed to be on BP meds, but cannot remember to take them.  Review of Systems  Constitutional:  Negative for chills and fever.  Respiratory:  Negative for shortness of breath.   Cardiovascular:  Negative for chest pain.  Gastrointestinal:  Negative for abdominal pain, nausea and vomiting.  Genitourinary:  Negative for dysuria.  Skin:  Negative for rash.      Objective:    BP (!) 141/107   Pulse 76   Wt 242 lb (109.8 kg)   BMI 35.74 kg/m  Physical Exam Exam conducted with a chaperone present.  Constitutional:      General: She is not in acute distress.    Appearance: She is well-developed.  HENT:     Head: Normocephalic and atraumatic.  Eyes:     General: No scleral icterus. Cardiovascular:     Rate and Rhythm: Normal rate.  Pulmonary:     Effort: Pulmonary effort is normal.  Abdominal:     Palpations: Abdomen is soft.  Musculoskeletal:     Cervical back: Neck supple.  Skin:    General: Skin is warm and dry.  Neurological:     Mental Status: She is alert and oriented to person, place, and time.   Procedure: Patient given informed consent for removal of her Nexplanon, time out was performed.  Signed copy in the chart.  Appropriate time out taken. Nexplanon site identified.  Area prepped in usual sterile fashon. One cc of 1% lidocaine was used to anesthetize the area at the distal end of the implant. A small stab incision was made right beside the implant on the distal portion.  The Nexplanon rod was grasped using hemostats and removed without difficulty.  There was less than 3 cc blood loss. There were no complications.  A small amount of antibiotic ointment and steri-strips were applied over the small  incision.  A pressure bandage was applied to reduce any bruising.  The patient tolerated the procedure well and was given post procedure instructions.       Assessment & Plan:   Problem List Items Addressed This Visit       Unprioritized   Benign essential hypertension   Begin meds--DASH diet Consider clonidine patch as she cannot remember to take her meds. Importance of BP control reviewed.      Relevant Medications   amLODipine (NORVASC) 10 MG tablet   hydrochlorothiazide (HYDRODIURIL) 12.5 MG tablet   Other Visit Diagnoses       Nexplanon removal    -  Primary   declines other options for birth control.       Return if symptoms worsen or fail to improve.  Reva Bores, MD 07/20/2023 8:52 AM

## 2023-07-20 NOTE — Assessment & Plan Note (Addendum)
 Begin meds--DASH diet Consider clonidine patch as she cannot remember to take her meds. Importance of BP control reviewed.

## 2023-08-17 ENCOUNTER — Other Ambulatory Visit: Payer: Self-pay

## 2023-08-17 ENCOUNTER — Encounter (HOSPITAL_COMMUNITY): Payer: Self-pay

## 2023-08-17 ENCOUNTER — Ambulatory Visit (HOSPITAL_COMMUNITY)
Admission: RE | Admit: 2023-08-17 | Discharge: 2023-08-17 | Disposition: A | Discharge status: Home / Self Care | Payer: Self-pay | Source: Ambulatory Visit | Attending: Family Medicine | Admitting: Family Medicine

## 2023-08-17 VITALS — BP 144/73 | HR 77 | Temp 98.4°F | Resp 18

## 2023-08-17 DIAGNOSIS — N898 Other specified noninflammatory disorders of vagina: Secondary | ICD-10-CM | POA: Diagnosis not present

## 2023-08-17 DIAGNOSIS — Z113 Encounter for screening for infections with a predominantly sexual mode of transmission: Secondary | ICD-10-CM | POA: Insufficient documentation

## 2023-08-17 LAB — HIV ANTIBODY (ROUTINE TESTING W REFLEX): HIV Screen 4th Generation wRfx: NONREACTIVE

## 2023-08-17 NOTE — ED Provider Notes (Signed)
 [facility]    CSN: [id number] Arrival date & time: 08/17/23  1014      History   Chief Complaint Chief Complaint  Patient presents with   Vaginal Discharge    Entered by patient    HPI Kristine Oconnell is a 33 y.o. female.  Here with 4-5 day history of clear, watery vaginal discharge Not having odor or itching No rash or lesions Reports unprotected intercourse - no known exposures Not having urinary symptoms, NV, fever  LMP 4/15  Past Medical History:  Diagnosis Date   Hernia, inguinal    Hypertension    Supervision of other normal pregnancy, antepartum 04/12/2018    Nursing Staff Provider Office Location  [facility] Dating  Ultrasound 02/08/18 Language  English Anatomy US   Nml Flu Vaccine  Declined Genetic Screen  NIPS:   AFP:   First Screen:  Quad:   TDaP vaccine   Declined Hgb A1C or  GTT Early  Third trimester Normal  Rhogam     LAB RESULTS  Feeding Plan Breast? Blood Type O/Positive/-- (12/23 1430)  Contraception BTL? Antibody Negative (12/23 1430) Circ   Uterine contractions during pregnancy 08/26/2018    Patient Active Problem List   Diagnosis Date Noted   Trichomoniasis 07/16/2021   Benign essential hypertension 08/28/2018   History of gestational hypertension 02/11/2017   Genital herpes 02/02/2017    Past Surgical History:  Procedure Laterality Date   HERNIA REPAIR      OB History     Gravida  3   Para  2   Term  2   Preterm      AB  1   Living  2      SAB  0   IAB  1   Ectopic      Multiple  0   Live Births  2            Home Medications    Prior to Admission medications   Not on File    Family History Family History  Problem Relation Age of Onset   Hypertension Mother    Diabetes Maternal Aunt    Hypertension Maternal Aunt    Cancer Maternal Grandmother        breast   Cancer Maternal Grandfather    Stroke Maternal Grandfather     Social History Social History   Tobacco Use   Smoking status:  Former    Types: Cigarettes   Smokeless tobacco: Never   Tobacco comments:    passive smoke exposure  Vaping Use   Vaping status: Never Used  Substance Use Topics   Alcohol use: Not Currently   Drug use: Not Currently     Allergies   Sulfa antibiotics   Review of Systems Review of Systems  Genitourinary:  Positive for vaginal discharge.   Per HPI  Physical Exam Triage Vital Signs ED Triage Vitals  Encounter Vitals Group     BP 08/17/23 1036 (!) 144/73     Systolic BP Percentile --      Diastolic BP Percentile --      Pulse Rate 08/17/23 1036 77     Resp 08/17/23 1036 18     Temp 08/17/23 1036 98.4 F (36.9 C)     Temp Source 08/17/23 1036 Oral     SpO2 08/17/23 1036 97 %     Weight --      Height --      Head Circumference --  Peak Flow --      Pain Score 08/17/23 1033 0     Pain Loc --      Pain Education --      Exclude from Growth Chart --    No data found.  Updated Vital Signs BP (!) 144/73 (BP Location: Right Arm) Comment: large  Pulse 77   Temp 98.4 F (36.9 C) (Oral)   Resp 18   LMP 08/04/2023 (Approximate)   SpO2 97%   Physical Exam Vitals and nursing note reviewed.  Constitutional:      General: She is not in acute distress.    Appearance: Normal appearance.  HENT:     Mouth/Throat:     Pharynx: Oropharynx is clear.  Cardiovascular:     Rate and Rhythm: Normal rate and regular rhythm.     Pulses: Normal pulses.     Heart sounds: Normal heart sounds.  Pulmonary:     Effort: Pulmonary effort is normal.     Breath sounds: Normal breath sounds.  Abdominal:     General: There is no distension.     Palpations: Abdomen is soft.     Tenderness: There is no abdominal tenderness. There is no right CVA tenderness, left CVA tenderness or guarding.  Neurological:     Mental Status: She is alert and oriented to person, place, and time.      UC Treatments / Results  Labs (all labs ordered are listed, but only abnormal results are  displayed) Labs Reviewed  HIV ANTIBODY (ROUTINE TESTING W REFLEX)  RPR  CERVICOVAGINAL ANCILLARY ONLY    EKG   Radiology No results found.  Procedures Procedures (including critical care time)  Medications Ordered in UC Medications - No data to display  Initial Impression / Assessment and Plan / UC Course  I have reviewed the triage vital signs and the nursing notes.  Pertinent labs & imaging results that were available during my care of the patient were reviewed by me and considered in my medical decision making (see chart for details).  Cytology swab pending along with HIV/RPR Treat positive result as indicated Safe sex practices No questions at this time   Patient also reports she runs constipated. We discussed use of miralax prn  Final Clinical Impressions(s) / UC Diagnoses   Final diagnoses:  Vaginal discharge  Screen for STD (sexually transmitted disease)     Discharge Instructions      We will call you if anything on your swab or blood work returns positive. You can also see these results on MyChart. Please abstain from sexual intercourse until your results return.  I recommend polyethylene glycol (brand name MiraLax) to help with constipation. Take with lots of water! You can use daily if needed, or every other day, once a week, etc.    ED Prescriptions   None    PDMP not reviewed this encounter.   Maleak Brazzel, Ivette Marks, [place] 08/17/23 1121

## 2023-08-17 NOTE — Discharge Instructions (Signed)
 We will call you if anything on your swab or blood work returns positive. You can also see these results on MyChart. Please abstain from sexual intercourse until your results return.  I recommend polyethylene glycol (brand name MiraLax) to help with constipation. Take with lots of water! You can use daily if needed, or every other day, once a week, etc.

## 2023-08-17 NOTE — ED Triage Notes (Signed)
 Reports a watery discharge.  This started 4-5 days ago after having unprotected sex recently.  Denies back pain or stomach pain.  Denies urinary symptoms.  Pregnancy test was negative.  Just "something is not right"

## 2023-08-18 LAB — RPR: RPR Ser Ql: NONREACTIVE

## 2023-08-19 ENCOUNTER — Telehealth (HOSPITAL_COMMUNITY): Payer: Self-pay

## 2023-08-19 LAB — CERVICOVAGINAL ANCILLARY ONLY
Bacterial Vaginitis (gardnerella): POSITIVE — AB
Candida Glabrata: NEGATIVE
Candida Vaginitis: NEGATIVE
Chlamydia: NEGATIVE
Comment: NEGATIVE
Comment: NEGATIVE
Comment: NEGATIVE
Comment: NEGATIVE
Comment: NEGATIVE
Comment: NORMAL
Neisseria Gonorrhea: NEGATIVE
Trichomonas: NEGATIVE

## 2023-08-19 MED ORDER — METRONIDAZOLE 500 MG PO TABS: 500.0000 mg | ORAL_TABLET | Freq: Two times a day (BID) | ORAL | 0 refills | Status: AC

## 2023-08-19 NOTE — Telephone Encounter (Signed)
 Per protocol, pt requires tx with metronidazole. Rx sent to pharmacy on file.

## 2023-10-24 DIAGNOSIS — Z111 Encounter for screening for respiratory tuberculosis: Secondary | ICD-10-CM | POA: Diagnosis not present

## 2023-11-04 ENCOUNTER — Encounter (INDEPENDENT_AMBULATORY_CARE_PROVIDER_SITE_OTHER): Payer: Self-pay | Admitting: Primary Care

## 2023-11-04 ENCOUNTER — Ambulatory Visit (INDEPENDENT_AMBULATORY_CARE_PROVIDER_SITE_OTHER): Admitting: Primary Care

## 2023-11-04 VITALS — BP 142/99 | HR 78 | Resp 16 | Ht 70.0 in | Wt 245.8 lb

## 2023-11-04 DIAGNOSIS — F4310 Post-traumatic stress disorder, unspecified: Secondary | ICD-10-CM

## 2023-11-04 DIAGNOSIS — I10 Essential (primary) hypertension: Secondary | ICD-10-CM
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
## 2023-11-08 MED ORDER — AMLODIPINE BESYLATE 10 MG PO TABS: 10.0000 mg | ORAL_TABLET | Freq: Every day | ORAL | 1 refills | Status: AC

## 2023-11-09 ENCOUNTER — Encounter (INDEPENDENT_AMBULATORY_CARE_PROVIDER_SITE_OTHER): Payer: Self-pay | Admitting: Primary Care

## 2023-11-09 NOTE — Telephone Encounter (Signed)
 Will forward to provider

## 2023-11-12 ENCOUNTER — Telehealth: Payer: Self-pay

## 2023-11-12 DIAGNOSIS — F419 Anxiety disorder, unspecified: Secondary | ICD-10-CM

## 2023-11-12 NOTE — Telephone Encounter (Signed)
 Rental assistance. Started new job Owes: 1119.75 back rent + late fee Rent: 1070 Wants letter from [place] for landlord Send resources via My Chart Find help now Healthy blue

## 2023-11-16 ENCOUNTER — Encounter (INDEPENDENT_AMBULATORY_CARE_PROVIDER_SITE_OTHER): Payer: Self-pay | Admitting: Primary Care

## 2023-11-16 ENCOUNTER — Telehealth: Payer: Self-pay | Admitting: *Deleted

## 2023-11-16 ENCOUNTER — Encounter: Payer: Self-pay | Admitting: Primary Care

## 2023-11-16 NOTE — Progress Notes (Signed)
 Complex Care Management Note  Care Guide Note 11/16/2023 Name: Kristine Oconnell MRN: [id number] DOB: February 24, 1991  Kristine Oconnell is a 33 y.o. year old female who sees Celestia Rosaline SQUIBB, NP for primary care. I reached out to Geni Kurk by phone today to offer complex care management services.  Ms. Watton was given information about Complex Care Management services today including:   The Complex Care Management services include support from the care team which includes your Nurse Care Manager, Clinical Social Worker, or Pharmacist.  The Complex Care Management team is here to help remove barriers to the health concerns and goals most important to you. Complex Care Management services are voluntary, and the patient may decline or stop services at any time by request to their care team member.   Complex Care Management Consent Status: Patient agreed to services and verbal consent obtained.   Follow up plan:  Telephone appointment with complex care management team member scheduled for:  11/23/2023  Encounter Outcome:  Patient Scheduled  Thedford Franks, CMA [place]  [facility] Guide Direct Dial: [phone]  Fax: [phone] Website: [email or web address]

## 2023-11-16 NOTE — Telephone Encounter (Signed)
 I spoke to the patient and informed her that the letter she requested has been written and she said she will come to [employer] this afternoon to pick it up

## 2023-11-16 NOTE — Telephone Encounter (Signed)
 Will forward to provider

## 2023-11-23 ENCOUNTER — Other Ambulatory Visit: Payer: Self-pay

## 2023-11-23 DIAGNOSIS — F411 Generalized anxiety disorder: Secondary | ICD-10-CM

## 2023-11-23 NOTE — Patient Instructions (Signed)
 Visit Information  Ms. Vanantwerp was given information about Medicaid Managed Care team care coordination services as a part of their [employer] Medicaid benefit. Sherene Plancarte   If you would like to schedule transportation through your Healthy [facility] plan, please call the following number at least 2 days in advance of your appointment: [phone]  For information about your ride after you set it up, call Ride Assist at [phone]. Use this number to activate a Will Call pickup, or if your transportation is late for a scheduled pickup. Use this number, too, if you need to make a change or cancel a previously scheduled reservation.  If you need transportation services right away, call [phone]. The after-hours call center is staffed 24 hours to handle ride assistance and urgent reservation requests (including discharges) 365 days a year. Urgent trips include sick visits, hospital discharge requests and life-sustaining treatment.  Call the [facility] Line at [phone], at any time, 24 hours a day, 7 days a week. If you are in danger or need immediate medical attention call 911.   Ms. Brindisi - following are the goals we discussed in your visit today:   Goals Addressed             This Visit's Progress    BSW VBCI Social Work Care Plan   On track    Problems:   Food Insecurity  and rental assistance  CSW Clinical Goal(s):   Over the next 2 weeks the Patient will work with Child psychotherapist to address concerns related to food insecurity and rental assistance..  Interventions:  BSW will provide patient information to food pantries in [facility]. BSW will refer patient to [place]. [employer] for possible rental assistance.  Patient Goals/Self-Care Activities:  Access food pantries and rental assistance  Plan:   Telephone follow up appointment with care management team member scheduled for:  12/07/2023 at 1:15pm        Patient verbalizes  understanding of instructions and care plan provided today and agrees to view in MyChart. Active MyChart status and patient understanding of how to access instructions and care plan via MyChart confirmed with patient.     Telephone follow up appointment with Managed Medicaid care management team member scheduled for: 12/07/2023 at 1:15pm  Laymon Doll, BSW [place]/VBCI - [facility] Social Worker [phone]   Following is a copy of your plan of care:  There are no care plans that you recently modified to display for this patient.

## 2023-11-23 NOTE — Patient Outreach (Addendum)
 Complex Care Management   Visit Note  11/23/2023  Name:  Kristine Oconnell MRN: [id number] DOB: 04-14-1991  Situation: Referral received for Complex Care Management related to SDOH Barriers:  Food insecurity Rental assistance I obtained verbal consent from Patient.  Visit completed with patient  on the phone  Background:   Past Medical History:  Diagnosis Date   Hernia, inguinal    Hypertension    Supervision of other normal pregnancy, antepartum 04/12/2018    Nursing Staff Provider [facility]  Ultrasound 02/08/18 Language  English Anatomy US   Nml Flu Vaccine  Declined Genetic Screen  NIPS:   AFP:   First Screen:  Quad:   TDaP vaccine   Declined Hgb A1C or  GTT Early  Third trimester Normal  Rhogam     LAB RESULTS  Feeding Plan Breast? Blood Type O/Positive/-- (12/23 1430)  Contraception BTL? Antibody Negative (12/23 1430) Circ   Uterine contractions during pregnancy 08/26/2018    Assessment: BSW held initial call with patient. Patient was alert and cognitive. SDOH needs were assessed and the following needs were identified: food insecurity and rental assistance. Patient confirmed she sometimes worries about food running out. Patient has 2 dependent children and also works and gets [employer], but was receptive to food pantries and other food resources. Patient was provided via email a list of [address] pantries along with mobile food market distribution schedule for [employer]. Patient was also informed of extra benefits through Healthy Blue. Patient reports being aware of housing benefit, but states healthy blue no longer assist with rental. However, patient wil be reaching out regarding their food benefit (fresh fruits & veggies for up to 3 months, $120 value). Patient was also referred to [place]. [employer] for rental assistance and provided their contact information for further questions via email. Patient confirmed these were her only two  needs at the moment. Patient was scheduled for an initial call with Jasmine Lewis, LCSW for 12/15/2023 at 11AM. No other resources were provided/requested at this time.   SDOH Interventions    Flowsheet Row Patient Outreach Telephone from 11/23/2023 in [place] POPULATION HEALTH DEPARTMENT Telephone from 11/12/2023 in [facility] [place] [facility] Of [place]. [facility] Office Visit from 11/04/2023 in [facility]  SDOH Interventions     Food Insecurity Interventions Community Resources Provided -- AMB Referral  Housing Interventions Community Resources Provided AMB Referral, Other (Comment), Community Resources Provided  [referral to VBCI.] Intervention Not Indicated  Transportation Interventions --  [patient has her own transportaiton.] -- Intervention Not Indicated  Utilities Interventions Intervention Not Indicated -- Intervention Not Indicated  Depression Interventions/Treatment  -- -- Cytogeneticist Strain Interventions Community Resources Provided -- --      Recommendation:   Appt with LCSW Rolin Kerns 12/15/2023 at 11AM.   Follow Up Plan:   Telephone follow up appointment date/time:  12/07/2023 at 1:15pm  Laymon Doll, BSW [place]/VBCI - [facility] Social Worker [phone]

## 2023-12-07 ENCOUNTER — Other Ambulatory Visit: Payer: Self-pay

## 2023-12-07 NOTE — Patient Instructions (Signed)
 Visit Information  Ms. Gallentine was given information about Medicaid Managed Care team care coordination services as a part of their [employer] Medicaid benefit. Fantashia Shupert   If you would like to schedule transportation through your Healthy [facility] plan, please call the following number at least 2 days in advance of your appointment: [phone]  For information about your ride after you set it up, call Ride Assist at [phone]. Use this number to activate a Will Call pickup, or if your transportation is late for a scheduled pickup. Use this number, too, if you need to make a change or cancel a previously scheduled reservation.  If you need transportation services right away, call [phone]. The after-hours call center is staffed 24 hours to handle ride assistance and urgent reservation requests (including discharges) 365 days a year. Urgent trips include sick visits, hospital discharge requests and life-sustaining treatment.  Call the [facility] Line at [phone], at any time, 24 hours a day, 7 days a week. If you are in danger or need immediate medical attention call 911.   Ms. Sens - following are the goals we discussed in your visit today:   Goals Addressed             This Visit's Progress    BSW VBCI Social Work Care Plan   On track    Problems:   Food Insecurity  and rental assistance  CSW Clinical Goal(s):   Over the next 2 weeks the Patient will work with Child psychotherapist to address concerns related to food insecurity and rental assistance..  Interventions:  BSW will provide patient information to food pantries in [facility]. BSW will refer patient to [place]. [employer] for possible rental assistance.  Patient Goals/Self-Care Activities:  Access food pantries and rental assistance  Plan:   Telephone follow up appointment with care management team member scheduled for:  12/07/2023 at 1:15pm        Patient verbalizes  understanding of instructions and care plan provided today and agrees to view in MyChart. Active MyChart status and patient understanding of how to access instructions and care plan via MyChart confirmed with patient.     Telephone follow up appointment with Managed Medicaid care management team member scheduled for: 12/22/2023 at 11:30AM.  Laymon Doll, BSW [place]/VBCI - [facility] Social Worker [phone]   Following is a copy of your plan of care:  There are no care plans that you recently modified to display for this patient.

## 2023-12-07 NOTE — Patient Outreach (Signed)
 Complex Care Management   Visit Note  12/07/2023  Name:  Kristine Oconnell MRN: [id number] DOB: 1991-04-02  Situation: Referral received for Complex Care Management related to SDOH Barriers:  Housing rental assistance Food insecurity I obtained verbal consent from Patient.  Visit completed with patient  on the phone  Background:   Past Medical History:  Diagnosis Date   Hernia, inguinal    Hypertension    Supervision of other normal pregnancy, antepartum 04/12/2018    Nursing Staff Provider [facility]  Ultrasound 02/08/18 Language  English Anatomy US   Nml Flu Vaccine  Declined Genetic Screen  NIPS:   AFP:   First Screen:  Quad:   TDaP vaccine   Declined Hgb A1C or  GTT Early  Third trimester Normal  Rhogam     LAB RESULTS  Feeding Plan Breast? Blood Type O/Positive/-- (12/23 1430)  Contraception BTL? Antibody Negative (12/23 1430) Circ   Uterine contractions during pregnancy 08/26/2018    Assessment: BSW held f/u call with pt. Pt was alert and cognitive. Pt reports she called [employer] for rental assistance and completed a 10-question questionnaire online. Pt was offered $300 in rental assistance. However, pt declined assistance stating $300 might be all of what someone else needs. Pt was strongly encouraged to not decline any form of financial assistance as any assistance is better than nothing. Pt was strongly encouraged and advised to call [employer] next month and request assistance when their September application period opens (09/08 at [facility]).  Pt confirmed she received food resources for out of the garden project, but has not looked over them or accessed them. Pt was provided via email of upcoming food distributions for this week and encouraged to attend if she is experiencing a food insecurity. No additional resources were provided/requested at this time.   SDOH Interventions    Flowsheet Row Patient Outreach Telephone from 11/23/2023 in [place] POPULATION HEALTH  DEPARTMENT Telephone from 11/12/2023 in [facility] Comm [facility] [place] - A Dept Of . [facility] Office Visit from 11/04/2023 in [facility]  SDOH Interventions     Food Insecurity Interventions Community Resources Provided -- AMB Referral  Housing Interventions Community Resources Provided AMB Referral, Other (Comment), Community Resources Provided  [referral to VBCI.] Intervention Not Indicated  Transportation Interventions --  [patient has her own transportaiton.] -- Intervention Not Indicated  Utilities Interventions Intervention Not Indicated -- Intervention Not Indicated  Depression Interventions/Treatment  -- -- Cytogeneticist Strain Interventions Community Resources Provided -- --      Recommendation:   Attend LCSW appt and ask about providers for mental health.   Follow Up Plan:   Telephone follow up appointment date/time:  12/22/2023 at 11:30AM.  Laymon Doll, BSW /VBCI - [facility] Social Worker [phone]

## 2023-12-15 ENCOUNTER — Other Ambulatory Visit: Payer: Self-pay | Admitting: Licensed Clinical Social Worker

## 2023-12-18 NOTE — Patient Outreach (Signed)
 Complex Care Management   Visit Note  12/15/2023  Name:  Kristine Oconnell MRN: [id number] DOB: 03-16-1991  Situation: Referral received for Complex Care Management related to Mental/Behavioral Health diagnosis Anxiety and Depression I obtained verbal consent from Patient.  Visit completed with Patient  on the phone  Background:   Past Medical History:  Diagnosis Date   Hernia, inguinal    Hypertension    Supervision of other normal pregnancy, antepartum 04/12/2018    Nursing Staff Provider [facility]  Ultrasound 02/08/18 Language  English Anatomy US   Nml Flu Vaccine  Declined Genetic Screen  NIPS:   AFP:   First Screen:  Quad:   TDaP vaccine   Declined Hgb A1C or  GTT Early  Third trimester Normal  Rhogam     LAB RESULTS  Feeding Plan Breast? Blood Type O/Positive/-- (12/23 1430)  Contraception BTL? Antibody Negative (12/23 1430) Circ   Uterine contractions during pregnancy 08/26/2018    Assessment: Patient Reported Symptoms:  Cognitive Cognitive Status: Alert and oriented to person, place, and time, Normal speech and language skills Cognitive/Intellectual Conditions Management [RPT]: None reported or documented in medical history or problem list   Health Maintenance Behaviors: Annual physical exam  Neurological Neurological Review of Symptoms: No symptoms reported    HEENT HEENT Symptoms Reported: No symptoms reported      Cardiovascular Cardiovascular Symptoms Reported: No symptoms reported Does patient have uncontrolled Hypertension?: No    Respiratory Respiratory Symptoms Reported: No symptoms reported    Endocrine Endocrine Symptoms Reported: No symptoms reported Is patient diabetic?: No    Gastrointestinal Gastrointestinal Symptoms Reported: No symptoms reported      Genitourinary Genitourinary Symptoms Reported: No symptoms reported    Integumentary Integumentary Symptoms Reported: No symptoms reported    Musculoskeletal Musculoskelatal  Symptoms Reviewed: No symptoms reported   Falls in the past year?: No Number of falls in past year: 1 or less Was there an injury with Fall?: No Fall Risk Category Calculator: 0 Patient Fall Risk Level: Low Fall Risk Patient at Risk for Falls Due to: No Fall Risks  Psychosocial Psychosocial Symptoms Reported: Alteration in sleep habits, Alteration in eating habits, Report of significant loss, deaths, abandonment, traumatic incidents Behavioral Management Strategies: Coping strategies, Community resources, Adequate rest Major Change/Loss/Stressor/Fears (CP): School or job, Traumatic event, Armed forces operational officer concerns, Resources Techniques to [employer] with Loss/Stress/Change: Diversional activities Quality of Family Relationships: involved    12/18/2023    PHQ2-9 Depression Screening   Little interest or pleasure in doing things More than half the days  Feeling down, depressed, or hopeless More than half the days  PHQ-2 - Total Score 4  Trouble falling or staying asleep, or sleeping too much More than half the days  Feeling tired or having little energy More than half the days  Poor appetite or overeating  More than half the days  Feeling bad about yourself - or that you are a failure or have let yourself or your family down Nearly every day  Trouble concentrating on things, such as reading the newspaper or watching television Several days  Moving or speaking so slowly that other people could have noticed.  Or the opposite - being so fidgety or restless that you have been moving around a lot more than usual Not at all  Thoughts that you would be better off dead, or hurting yourself in some way Several days  PHQ2-9 Total Score 15  If you checked off any problems, how difficult have these problems  made it for you to do your work, take care of things at home, or get along with other people Extremely dIfficult  Depression Interventions/Treatment Community Resources Provided    There were no vitals filed for  this visit.  Medications Reviewed Today     Reviewed by Ezzard Rolin BIRCH, LCSW (Social Worker) on 12/15/23 at 1202  Med List Status: <None>   Medication Order Taking? Sig Documenting Provider Last Dose Status Informant  amLODipine  (NORVASC ) 10 MG tablet [id number] Yes Take 1 tablet (10 mg total) by mouth daily. Celestia Rosaline SQUIBB, NP  Active             Recommendation:   PCP Follow-up Continue Current Plan of Care  Follow Up Plan:   Telephone follow-up in 1 month  Rolin Ezzard, LCSW [facility]  [facility] Clinical Social Worker Direct Dial: [phone]  Fax: [phone] Website: [email or web address] 5:14 PM

## 2023-12-18 NOTE — Patient Instructions (Signed)
 Visit Information  Ms. Buesing was given information about Medicaid Managed Care team care coordination services as a part of their Healthy Blue Medicaid benefit. Nichoel Digiulio   If you would like to schedule transportation through your Healthy [facility] plan, please call the following number at least 2 days in advance of your appointment: [phone]  For information about your ride after you set it up, call Ride Assist at [phone]. Use this number to activate a Will Call pickup, or if your transportation is late for a scheduled pickup. Use this number, too, if you need to make a change or cancel a previously scheduled reservation.  If you need transportation services right away, call [phone]. The after-hours call center is staffed 24 hours to handle ride assistance and urgent reservation requests (including discharges) 365 days a year. Urgent trips include sick visits, hospital discharge requests and life-sustaining treatment.  Call the [facility] Line at [phone], at any time, 24 hours a day, 7 days a week. If you are in danger or need immediate medical attention call 911.   Ms. Miltenberger - following are the goals we discussed in your visit today:   Goals Addressed             This Visit's Progress    LCSW VBCI Social Work Care Plan   On track    Problems:   Disease Management support and education needs related to Depression: anxiety and disturbed sleep  CSW Clinical Goal(s):   Over the next 90 days the Patient will attend all scheduled medical appointments as evidenced by patient report and care team review of appointment completion in electronic MEDICAL RECORD NUMBER  demonstrate a reduction in symptoms related to Depression: anxiety disturbed sleep .  Interventions:  Mental Health:  Evaluation of current treatment plan related to Depression: anxiety and disturbed sleep Active listening / Reflection utilized Financial risk analyst / information  provided Discussed referral options to connect for ongoing therapy: LCSW discussed local agencies to assist with strengthening support system. Pt requests referral within [facility] network. LCSW will collaborate with PCP on internal referral Emotional Support Provided Mindfulness or Relaxation training provided PHQ2/PHQ9 completed Provided general psycho-education for mental health needs Quality of sleep assessed & Sleep Hygiene techniques promoted GAD7 completed  Patient Goals/Self-Care Activities:  Continue taking your medication as prescribed.   Increase coping skills and stress reduction by utilizing healthy coping skills discussed on a routine basis to assist with decrease in depression, anxiety, and/or stress  Patient will review supportive resources, including, crisis intervention resources provided via email  Patient will follow up with provider within 30 days to inquire about medication management to assist with sleep concerns  Patient will request services through Legal Aid to assist with upcoming court hearing within 30 days  Plan:   Telephone follow up appointment with care management team member scheduled for:  2-4 weeks        Please see education materials related to topics discussed provided by MyChart link.  Patient verbalizes understanding of instructions and care plan provided today and agrees to view in MyChart. Active MyChart status and patient understanding of how to access instructions and care plan via MyChart confirmed with patient.     Licensed Clinical Social Worker will follow up with patient on 09/16 at 10:30 AM  Rolin Kerns, LCSW [place]  [facility], [facility] Clinical Social Worker Direct Dial: [phone]  Fax: [phone] Website: [email or web address] 5:14 PM   Following is a copy  of your plan of care:  There are no care plans that you recently modified to display for this patient.

## 2023-12-22 ENCOUNTER — Other Ambulatory Visit: Payer: Self-pay

## 2023-12-22 NOTE — Patient Instructions (Signed)
 Visit Information  Kristine Oconnell was given information about Medicaid Managed Care team care coordination services as a part of their Healthy Blue Medicaid benefit. Yael Coppess   If you would like to schedule transportation through your Healthy [facility] plan, please call the following number at least 2 days in advance of your appointment: [phone]  For information about your ride after you set it up, call Ride Assist at [phone]. Use this number to activate a Will Call pickup, or if your transportation is late for a scheduled pickup. Use this number, too, if you need to make a change or cancel a previously scheduled reservation.  If you need transportation services right away, call [phone]. The after-hours call center is staffed 24 hours to handle ride assistance and urgent reservation requests (including discharges) 365 days a year. Urgent trips include sick visits, hospital discharge requests and life-sustaining treatment.  Call the [facility] Line at [phone], at any time, 24 hours a day, 7 days a week. If you are in danger or need immediate medical attention call 911.   Kristine Oconnell - following are the goals we discussed in your visit today:   Goals Addressed             This Visit's Progress    COMPLETED: BSW VBCI Social Work Care Plan   On track    Problems:   Food Insecurity  and rental assistance  CSW Clinical Goal(s):   Over the next 2 weeks the Patient will work with Child psychotherapist to address concerns related to food insecurity and rental assistance..  Interventions:  BSW will provide patient information to food pantries in [facility]. BSW will refer patient to [place]. [employer] for possible rental assistance.  Patient Goals/Self-Care Activities:  Access food pantries and rental assistance  Plan:   Telephone follow up appointment with care management team member scheduled for:  12/07/2023 at 1:15pm        Please see  education materials related to food, housing, and HealthyBlue value-added benefits provided by email.  Patient verbalizes understanding of instructions and care plan provided today and agrees to view in MyChart. Active MyChart status and patient understanding of how to access instructions and care plan via MyChart confirmed with patient.     No further follow-up required at this time.   Laymon Doll, BSW [place]/VBCI - [employer] Social Worker [phone]   Following is a copy of your plan of care:  There are no care plans that you recently modified to display for this patient.

## 2023-12-22 NOTE — Patient Outreach (Signed)
 Complex Care Management   Visit Note  12/22/2023  Name:  Kristine Oconnell MRN: [id number] DOB: [birth year]  Situation: Referral received for Complex Care Management related to SDOH Barriers:  Housing rental assistance/affordable housing Food insecurity I obtained verbal consent from Patient.  Visit completed with Patient  on the phone  Background:   Past Medical History:  Diagnosis Date   Hernia, inguinal    Hypertension    Supervision of other normal pregnancy, antepartum 04/12/2018    Nursing Staff Provider Office Location  [place] Dating  Ultrasound 02/08/18 Language  English Anatomy US   Nml Flu Vaccine  Declined Genetic Screen  NIPS:   AFP:   First Screen:  Quad:   TDaP vaccine   Declined Hgb A1C or  GTT Early  Third trimester Normal  Rhogam     LAB RESULTS  Feeding Plan Breast? Blood Type O/Positive/-- (12/23 1430)  Contraception BTL? Antibody Negative (12/23 1430) Circ   Uterine contractions during pregnancy 08/26/2018    Assessment: BSW held f/u appt with pt. Pt was alert and cognitive. Pt reports she has court today to finalize her living situation. Pt reports she will be moving in with a family member temporarily and would appreciate any housing resources as well as food resources. BSW will provide resources for both needs via email. Pt understood and agreed. BSW will also send patient information re healthy blue value-added benefits for food. Pt was informed on how to get connected to social work services again in the future should her needs change or need additional resources. Pt understood and confirmed she was ok with case closure.  SDOH Interventions    Flowsheet Row Patient Outreach Telephone from 12/15/2023 in [place] POPULATION HEALTH DEPARTMENT Patient Outreach Telephone from 11/23/2023 in [place] POPULATION HEALTH DEPARTMENT Telephone from 11/12/2023 in [facility] Comm [facility] [place] - A Dept Of [place]. [facility] Office Visit from 11/04/2023 in [facility]  [facility]  SDOH Interventions      Food Insecurity Interventions -- Community Resources Provided -- AMB Referral  Housing Interventions -- Community Resources Provided AMB Referral, Other (Comment), Community Resources Provided  [employer] to VBCI.] Intervention Not Indicated  Transportation Interventions -- --  [patient has her own transportaiton.] -- Intervention Not Indicated  Utilities Interventions -- Intervention Not Indicated -- Intervention Not Indicated  Depression Interventions/Treatment  [employer] Provided -- -- Cytogeneticist Strain Interventions -- [employer] Provided -- --      Recommendation:   None  Follow Up Plan:   Patient has met all care management goals. Care Management case will be closed. Patient has been provided contact information should new needs arise.   Laymon Doll, BSW [place]/VBCI - [employer] Social Worker [phone]

## 2024-01-05 ENCOUNTER — Telehealth

## 2024-01-05 ENCOUNTER — Other Ambulatory Visit: Payer: Self-pay

## 2024-01-05 ENCOUNTER — Encounter (HOSPITAL_COMMUNITY): Payer: Self-pay | Admitting: *Deleted

## 2024-01-05 ENCOUNTER — Telehealth: Payer: Self-pay | Admitting: Licensed Clinical Social Worker

## 2024-01-05 ENCOUNTER — Ambulatory Visit (HOSPITAL_COMMUNITY): Admission: EM | Admit: 2024-01-05 | Discharge: 2024-01-05 | Disposition: A | Discharge status: Home / Self Care

## 2024-01-05 DIAGNOSIS — I872 Venous insufficiency (chronic) (peripheral): Secondary | ICD-10-CM

## 2024-01-05 NOTE — Discharge Instructions (Signed)
 Please follow-up with your primary care provider regarding the rash on your right lower extremity.  It is similar to the rash that appears on skin when there is insufficiency of the veins and poor circulation.    No treatment is needed at this time but it is important to determine the underlying cause of the rash.  This will require further evaluation by your primary care provider.  Thank you for visiting [facility] today.

## 2024-01-05 NOTE — Patient Instructions (Signed)
 Geni Kurk - I am sorry I was unable to reach you today for our scheduled appointment. I work with Celestia Rosaline SQUIBB, NP and am calling to support your healthcare needs. Please contact me at [phone] at your earliest convenience. I look forward to speaking with you soon.   Thank you,   Laymon Doll, BSW [place]/VBCI - [facility] Social Worker [phone]

## 2024-01-05 NOTE — Patient Outreach (Signed)
 BSW received request from Kristine Oconnell re patients request to speak to someone since Rolin Kerns is out. BSW attempted contact twice this afternoon without success. BSW provided VM requesting pt to call back. BSW will continue outreach efforts.

## 2024-01-05 NOTE — ED Triage Notes (Signed)
 Pt states she has had a rash since 05/2023 on her right lower leg. She has used multiple OTC creams and alcohol without relief. She states that she noticed when she eats sugar it flares up.

## 2024-01-05 NOTE — ED Provider Notes (Signed)
 [facility]    CSN: [id number] Arrival date & time: 01/05/24  1353    HISTORY   Chief Complaint  Patient presents with   Rash   HPI Kristine Oconnell is a pleasant, 33 y.o. female who presents to urgent care today. Pt states she has had a rash since 05/2023 on her right lower leg. She has applied multiple OTC creams and alcohol without relief. She states that she noticed that when she eats sugar it flares up.  Patient states that her bilateral lower extremities are chronically swollen, states that she keeps forgetting to bring this up with her primary care provider during visits.  The history is provided by the patient.  Rash  Past Medical History:  Diagnosis Date   Hernia, inguinal    Hypertension    Supervision of other normal pregnancy, antepartum 04/12/2018    Nursing Staff Provider Office Location  [place] Dating  Ultrasound 02/08/18 Language  English Anatomy US   Nml Flu Vaccine  Declined Genetic Screen  NIPS:   AFP:   First Screen:  Quad:   TDaP vaccine   Declined Hgb A1C or  GTT Early  Third trimester Normal  Rhogam     LAB RESULTS  Feeding Plan Breast? Blood Type O/Positive/-- (12/23 1430)  Contraception BTL? Antibody Negative (12/23 1430) Circ   Uterine contractions during pregnancy 08/26/2018   Patient Active Problem List   Diagnosis Date Noted   Trichomoniasis 07/16/2021   Benign essential hypertension 08/28/2018   History of gestational hypertension 02/11/2017   Genital herpes 02/02/2017   Past Surgical History:  Procedure Laterality Date   HERNIA REPAIR     OB History     Gravida  3   Para  2   Term  2   Preterm      AB  1   Living  2      SAB  0   IAB  1   Ectopic      Multiple  0   Live Births  2          Home Medications    Prior to Admission medications   Medication Sig Start Date End Date Taking? Authorizing Provider  amLODipine  (NORVASC ) 10 MG tablet Take 1 tablet (10 mg total) by mouth daily. 11/08/23  Yes Celestia Rosaline SQUIBB, NP    Family History Family History  Problem Relation Age of Onset   Hypertension Mother    Diabetes Maternal Aunt    Hypertension Maternal Aunt    Cancer Maternal Grandmother        breast   Cancer Maternal Grandfather    Stroke Maternal Grandfather    Social History Social History   Tobacco Use   Smoking status: Former    Types: Cigarettes   Smokeless tobacco: Never   Tobacco comments:    passive smoke exposure  Vaping Use   Vaping status: Never Used  Substance Use Topics   Alcohol use: Not Currently   Drug use: Not Currently   Allergies   Sulfa antibiotics  Review of Systems Review of Systems  Skin:  Positive for rash.   Pertinent findings revealed after performing a 14 point review of systems has been noted in the history of present illness.  Physical Exam Vital Signs BP (!) 148/91 (BP Location: Right Arm)   Pulse 94   Temp 98.3 F (36.8 C) (Oral)   Resp 16   LMP 12/26/2023 (Approximate)   SpO2 96%   No data found.  Physical Exam Vitals and nursing note reviewed.  Constitutional:      General: She is awake. She is not in acute distress.    Appearance: Normal appearance. She is well-developed and well-groomed. She is not ill-appearing.  Cardiovascular:     Rate and Rhythm: Normal rate and regular rhythm.  Musculoskeletal:     Right lower leg: 3+ Edema present.     Left lower leg: 3+ Edema present.  Skin:     Neurological:     Mental Status: She is alert.  Psychiatric:        Behavior: Behavior is cooperative.     Visual Acuity Right Eye Distance:   Left Eye Distance:   Bilateral Distance:    Right Eye Near:   Left Eye Near:    Bilateral Near:     UC Couse / Diagnostics / Procedures:     Radiology No results found.  Procedures Procedures (including critical care time) EKG  Pending results:  Labs Reviewed - No data to display  Medications Ordered in UC: Medications - No data to display  UC Diagnoses / Final  Clinical Impressions(s)   I have reviewed the triage vital signs and the nursing notes.  Pertinent labs & imaging results that were available during my care of the patient were reviewed by me and considered in my medical decision making (see chart for details).    Final diagnoses:  Venous stasis dermatitis of right lower extremity   Patient advised follow-up with PCP regarding this chronic condition for further evaluation of underlying cause.  Patient encouraged to discontinue use of alcohol as this makes the skin more dry and causes more irritation.  Please see discharge instructions below for details of plan of care as provided to patient. ED Prescriptions   None    PDMP not reviewed this encounter.  Pending results:  Labs Reviewed - No data to display    Discharge Instructions      Please follow-up with your primary care provider regarding the rash on your right lower extremity.  It is similar to the rash that appears on skin when there is insufficiency of the veins and poor circulation.    No treatment is needed at this time but it is important to determine the underlying cause of the rash.  This will require further evaluation by your primary care provider.  Thank you for visiting [facility] today.    Disposition Upon Discharge:  Condition: stable for discharge home  Patient presented with an acute illness with associated systemic symptoms and significant discomfort requiring urgent management. In my opinion, this is a condition that a prudent lay person (someone who possesses an average knowledge of health and medicine) may potentially expect to result in complications if not addressed urgently such as respiratory distress, impairment of bodily function or dysfunction of bodily organs.   Routine symptom specific, illness specific and/or disease specific instructions were discussed with the patient and/or caregiver at length.   As such, the patient has been  evaluated and assessed, work-up was performed and treatment was provided in alignment with urgent care protocols and evidence based medicine.  Patient/parent/caregiver has been advised that the patient may require follow up for further testing and treatment if the symptoms continue in spite of treatment, as clinically indicated and appropriate.  Patient/parent/caregiver has been advised to return to the [facility] or PCP if no better; to PCP or the Emergency Department if new signs and symptoms develop, or if the current  signs or symptoms continue to change or worsen for further workup, evaluation and treatment as clinically indicated and appropriate  The patient will follow up with their current PCP if and as advised. If the patient does not currently have a PCP we will assist them in obtaining one.   The patient may need specialty follow up if the symptoms continue, in spite of conservative treatment and management, for further workup, evaluation, consultation and treatment as clinically indicated and appropriate.  Patient/parent/caregiver verbalized understanding and agreement of plan as discussed.  All questions were addressed during visit.  Please see discharge instructions below for further details of plan.  This office note has been dictated using Teaching laboratory technician.  Unfortunately, this method of dictation can sometimes lead to typographical or grammatical errors.  I apologize for your inconvenience in advance if this occurs.  Please do not hesitate to reach out to me if clarification is needed.      Joesph Shaver Scales, PA-C 01/05/24 1536

## 2024-01-06 ENCOUNTER — Other Ambulatory Visit: Payer: Self-pay

## 2024-01-06 NOTE — Patient Outreach (Addendum)
 BSW attempted outreach twice this morning during scheduled appt time without success. BSW left VM requesting call back. BSW will continue outreach efforts this week and notify LCSW Jasmine.

## 2024-01-08 ENCOUNTER — Other Ambulatory Visit: Payer: Self-pay

## 2024-01-08 NOTE — Patient Instructions (Signed)
 Visit Information  Ms. Khurana was given information about Medicaid Managed Care team care coordination services as a part of their Healthy Blue Medicaid benefit. Melesa Lecy   If you would like to schedule transportation through your Healthy [facility] plan, please call the following number at least 2 days in advance of your appointment: [phone]  For information about your ride after you set it up, call Ride Assist at [phone]. Use this number to activate a Will Call pickup, or if your transportation is late for a scheduled pickup. Use this number, too, if you need to make a change or cancel a previously scheduled reservation.  If you need transportation services right away, call [phone]. The after-hours call center is staffed 24 hours to handle ride assistance and urgent reservation requests (including discharges) 365 days a year. Urgent trips include sick visits, hospital discharge requests and life-sustaining treatment.  Call the [facility] Line at [phone], at any time, 24 hours a day, 7 days a week. If you are in danger or need immediate medical attention call 911.   Patient verbalizes understanding of instructions and care plan provided today and agrees to view in MyChart. Active MyChart status and patient understanding of how to access instructions and care plan via MyChart confirmed with patient.     Telephone follow up appointment with Managed Medicaid care management team member scheduled for: 01/18/2024 at 10:30AM.   Laymon Doll, BSW [place]/VBCI - [facility] Social Worker [phone]   Following is a copy of your plan of care:  There are no care plans that you recently modified to display for this patient.

## 2024-01-08 NOTE — Patient Outreach (Signed)
 Complex Care Management   Visit Note  01/08/2024  Name:  Kristine Oconnell MRN: [id number] DOB: 1990/08/07  Situation: Referral received for Complex Care Management related to SDOH Barriers:  Housing avoiding eviction Food insecurity Financial Resource Strain I obtained verbal consent from Patient.  Visit completed with Patient  on the phone  Background:   Past Medical History:  Diagnosis Date   Hernia, inguinal    Hypertension    Supervision of other normal pregnancy, antepartum 04/12/2018    Nursing Staff Provider Office Location  [facility] Dating  Ultrasound 02/08/18 Language  English Anatomy US   Nml Flu Vaccine  Declined Genetic Screen  NIPS:   AFP:   First Screen:  Quad:   TDaP vaccine   Declined Hgb A1C or  GTT Early  Third trimester Normal  Rhogam     LAB RESULTS  Feeding Plan Breast? Blood Type O/Positive/-- (12/23 1430)  Contraception BTL? Antibody Negative (12/23 1430) Circ   Uterine contractions during pregnancy 08/26/2018    Assessment: BSW reached out to patient as requested by Olam Ally. Pt states she is still facing eviction process. Pt reports she was working with Forensic psychologist and had eviction dismissed on the grounds that she would be able to pay the past due balance within 10 business days. Today marks the 10th day and patient has not been able to pay full amount past due. Pt is asking multiple family members for help to cover the full amount and is waiting to hear back from one specific person. No other resources were provided at this time.   SDOH Interventions    Flowsheet Row Patient Outreach Telephone from 01/08/2024 in [place] POPULATION HEALTH DEPARTMENT Patient Outreach Telephone from 12/15/2023 in [place] POPULATION HEALTH DEPARTMENT Patient Outreach Telephone from 11/23/2023 in [place] POPULATION HEALTH DEPARTMENT Telephone from 11/12/2023 in [facility] Comm [facility] [place] - A Dept Of [place]. [facility] Office Visit from 11/04/2023 in [facility]  SDOH Interventions       Food Insecurity Interventions Community Resources Provided -- Community Resources Provided -- AMB Referral  Housing Interventions Community Resources Provided  exie is currently facing eviction.] -- Programmer, applications Provided AMB Referral, Other (Comment), Community Resources Provided  [referral to VBCI.] Intervention Not Indicated  Transportation Interventions --  [patient has her own transportation.] -- --  [patient has her own transportaiton.] -- Intervention Not Indicated  Utilities Interventions Intervention Not Indicated -- Intervention Not Indicated -- Intervention Not Indicated  Depression Interventions/Treatment  -- [employer] Provided -- -- Cytogeneticist Strain Interventions -- -- [employer] Provided -- --      Recommendation:   Continue communication with landlord regarding payment to avoid eviction.   Follow Up Plan:   Telephone follow up appointment date/time:  01/18/2024 at 10:30AM.  Laymon Doll, BSW /VBCI - [facility] Social Worker [phone]

## 2024-01-12 ENCOUNTER — Other Ambulatory Visit: Payer: Self-pay | Admitting: Licensed Clinical Social Worker

## 2024-01-18 ENCOUNTER — Telehealth: Payer: Self-pay

## 2024-01-18 NOTE — Patient Instructions (Signed)
 Visit Information  Ms. Daudelin was given information about Medicaid Managed Care team care coordination services as a part of their Healthy Blue Medicaid benefit. Kia Varnadore   If you would like to schedule transportation through your Healthy [facility] plan, please call the following number at least 2 days in advance of your appointment: [phone]  For information about your ride after you set it up, call Ride Assist at [phone]. Use this number to activate a Will Call pickup, or if your transportation is late for a scheduled pickup. Use this number, too, if you need to make a change or cancel a previously scheduled reservation.  If you need transportation services right away, call [phone]. The after-hours call center is staffed 24 hours to handle ride assistance and urgent reservation requests (including discharges) 365 days a year. Urgent trips include sick visits, hospital discharge requests and life-sustaining treatment.  Call the [facility] Line at [phone], at any time, 24 hours a day, 7 days a week. If you are in danger or need immediate medical attention call 911.   Please see education materials related to topics discussed provided by MyChart link.  Patient verbalizes understanding of instructions and care plan provided today and agrees to view in MyChart. Active MyChart status and patient understanding of how to access instructions and care plan via MyChart confirmed with patient.     Licensed Clinical Social Worker will call on 10/20 at 10:30 AM  Rolin Kerns, LCSW   [facility], [facility] Clinical Social Worker Direct Dial: [phone]  Fax: [phone] Website: [email or web address] 10:25 AM   Following is a copy of your plan of care:  There are no care plans that you recently modified to display for this patient.
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
## 2024-01-21 ENCOUNTER — Other Ambulatory Visit: Payer: Self-pay

## 2024-01-21 NOTE — Patient Instructions (Signed)
 Visit Information  Ms. Barretta was given information about Medicaid Managed Care team care coordination services as a part of their Healthy Blue Medicaid benefit. Kaithlyn Teagle   If you would like to schedule transportation through your Healthy [facility] plan, please call the following number at least 2 days in advance of your appointment: [phone]  For information about your ride after you set it up, call Ride Assist at [phone]. Use this number to activate a Will Call pickup, or if your transportation is late for a scheduled pickup. Use this number, too, if you need to make a change or cancel a previously scheduled reservation.  If you need transportation services right away, call [phone]. The after-hours call center is staffed 24 hours to handle ride assistance and urgent reservation requests (including discharges) 365 days a year. Urgent trips include sick visits, hospital discharge requests and life-sustaining treatment.  Call the [facility] Line at [phone], at any time, 24 hours a day, 7 days a week. If you are in danger or need immediate medical attention call 911.    Patient verbalizes understanding of instructions and care plan provided today and agrees to view in MyChart. Active MyChart status and patient understanding of how to access instructions and care plan via MyChart confirmed with patient.     No further follow up required: Patient declined further follow up appts.   Laymon Doll, BSW [place]/VBCI - [employer] Social Worker [phone]   Following is a copy of your plan of care:  There are no care plans that you recently modified to display for this patient.

## 2024-01-21 NOTE — Patient Outreach (Signed)
 Complex Care Management   Visit Note  01/21/2024  Name:  Kristine Oconnell MRN: [id number] DOB: [birth year]  Situation: Referral received for Complex Care Management related to SDOH Barriers:  Housing facing eviction Food insecurity Financial Resource Strain I obtained verbal consent from Patient.  Visit completed with Patient  on the phone  Background:   Past Medical History:  Diagnosis Date   Hernia, inguinal    Hypertension    Supervision of other normal pregnancy, antepartum 04/12/2018    Nursing Staff Provider Office Location  [facility] [place]ating  Ultrasound 02/08/18 Language  English Anatomy US   Nml Flu Vaccine  Declined Genetic Screen  NIPS:   AFP:   First Screen:  Quad:   TDaP vaccine   Declined Hgb A1C or  GTT Early  Third trimester Normal  Rhogam     LAB RESULTS  Feeding Plan Breast? Blood Type O/Positive/-- (12/23 1430)  Contraception BTL? Antibody Negative (12/23 1430) Circ   Uterine contractions during pregnancy 08/26/2018    Assessment: BSW held f/u appt with pt. Pt was alert and cognitive. Pt reports she did receive BSW food resource for mobile food market distribution via email on 09/24. Pt confirmed she did not have any other needs at this time and declined any further follow up appts. BSW informed pt he would inform Rolin Kerns LCSW in the event BSW needs to be looped back in  for services. Pt understood and agreed. No other resources were provided/requested at this time.   SDOH Interventions    Flowsheet Row Patient Outreach Telephone from 01/21/2024 in [place] POPULATION HEALTH DEPARTMENT Patient Outreach Telephone from 01/08/2024 in [place] POPULATION HEALTH DEPARTMENT Patient Outreach Telephone from 12/15/2023 in [place] POPULATION HEALTH DEPARTMENT Patient Outreach Telephone from 11/23/2023 in [place] POPULATION HEALTH DEPARTMENT Telephone from 11/12/2023 in [facility] [place] [facility] [place] Office Visit from 11/04/2023 in  [facility]  SDOH Interventions        Food Insecurity Interventions Community Resources Provided  [food resource provided on 09/24 via email.] Community Resources Provided -- [employer] Provided -- AMB Referral  Housing Interventions --  [pt is in eviction process.] Community Resources Provided  exie is currently facing eviction.] -- Community Resources Provided AMB Referral, Other (Comment), Community Resources Provided  [referral to VBCI.] Intervention Not Indicated  Transportation Interventions Intervention Not Indicated --  [patient has her own transportation.] -- --  [patient has her own transportaiton.] -- Intervention Not Indicated  Utilities Interventions Intervention Not Indicated Intervention Not Indicated -- Intervention Not Indicated -- Intervention Not Indicated  Depression Interventions/Treatment  -- -- Programmer, applications Provided -- -- Cytogeneticist Strain Interventions -- -- -- [employer] Provided -- --      Recommendation:   No recommendations at this time. Continue working with [employer].   Follow Up Plan:   No further appts scheduled. Patient declined further follow ups.   Laymon Doll, BSW [place]/VBCI - [employer] Social Worker [phone]

## 2024-02-08 ENCOUNTER — Other Ambulatory Visit: Payer: Self-pay | Admitting: Licensed Clinical Social Worker

## 2024-02-08 NOTE — Patient Outreach (Signed)
 Complex Care Management   Visit Note  02/08/2024  Name:  Kristine Oconnell MRN: [id number] DOB: 05/01/1990  Situation: Referral received for Complex Care Management related to Stress I obtained verbal consent from Patient.  Visit completed with Patient  on the phone  Background:   Past Medical History:  Diagnosis Date   Hernia, inguinal    Hypertension    Supervision of other normal pregnancy, antepartum 04/12/2018    Nursing Staff Provider Office Location  [facility] [place]  Ultrasound 02/08/18 Language  English Anatomy US   Nml Flu Vaccine  Declined Genetic Screen  NIPS:   AFP:   First Screen:  Quad:   TDaP vaccine   Declined Hgb A1C or  GTT Early  Third trimester Normal  Rhogam     LAB RESULTS  Feeding Plan Breast? Blood Type O/Positive/-- (12/23 1430)  Contraception BTL? Antibody Negative (12/23 1430) Circ   Uterine contractions during pregnancy 08/26/2018    Assessment: Patient Reported Symptoms:  Cognitive Cognitive Status: No symptoms reported, Alert and oriented to person, place, and time, Normal speech and language skills Cognitive/Intellectual Conditions Management [RPT]: None reported or documented in medical history or problem list   Health Maintenance Behaviors: Annual physical exam  Neurological Neurological Review of Symptoms: No symptoms reported    HEENT HEENT Symptoms Reported: Not assessed      Cardiovascular Cardiovascular Symptoms Reported: Not assessed    Respiratory Respiratory Symptoms Reported: Not assesed    Endocrine Endocrine Symptoms Reported: Not assessed    Gastrointestinal Gastrointestinal Symptoms Reported: Not assessed      Genitourinary Genitourinary Symptoms Reported: Not assessed    Integumentary Integumentary Symptoms Reported: Not assessed    Musculoskeletal Musculoskelatal Symptoms Reviewed: Not assessed        Psychosocial Psychosocial Symptoms Reported: Other Additional Psychological Details: Stress Behavioral Management  Strategies: Adequate rest, Support system, Exercise, Coping strategies Techniques to Cope with Loss/Stress/Change: Diversional activities      02/08/2024    PHQ2-9 Depression Screening   Little interest or pleasure in doing things    Feeling down, depressed, or hopeless    PHQ-2 - Total Score    Trouble falling or staying asleep, or sleeping too much    Feeling tired or having little energy    Poor appetite or overeating     Feeling bad about yourself - or that you are a failure or have let yourself or your family down    Trouble concentrating on things, such as reading the newspaper or watching television    Moving or speaking so slowly that other people could have noticed.  Or the opposite - being so fidgety or restless that you have been moving around a lot more than usual    Thoughts that you would be better off dead, or hurting yourself in some way    PHQ2-9 Total Score    If you checked off any problems, how difficult have these problems made it for you to do your work, take care of things at home, or get along with other people    Depression Interventions/Treatment      There were no vitals filed for this visit.  Medications Reviewed Today     Reviewed by Ezzard Rolin BIRCH, LCSW (Social Worker) on 02/08/24 at 1315  Med List Status: <None>   Medication Order Taking? Sig Documenting Provider Last Dose Status Informant  amLODipine  (NORVASC ) 10 MG tablet [id number] Yes Take 1 tablet (10 mg total) by mouth daily. Celestia Rosaline SQUIBB, NP  Active  Recommendation:   Continue Current Plan of Care  Follow Up Plan:   Telephone follow-up in 1 month  Rolin Kerns, LCSW [facility]  [facility], [facility] Clinical Social Worker Direct Dial: [phone]  Fax: [phone] Website: [email or web address] 2:07 PM

## 2024-02-08 NOTE — Patient Instructions (Signed)
 Visit Information  Ms. Bitton was given information about Medicaid Managed Care team care coordination services as a part of their Healthy Blue Medicaid benefit. Mechelle Pates   If you would like to schedule transportation through your Healthy [facility] plan, please call the following number at least 2 days in advance of your appointment: [phone]  For information about your ride after you set it up, call Ride Assist at [phone]. Use this number to activate a Will Call pickup, or if your transportation is late for a scheduled pickup. Use this number, too, if you need to make a change or cancel a previously scheduled reservation.  If you need transportation services right away, call [phone]. The after-hours call center is staffed 24 hours to handle ride assistance and urgent reservation requests (including discharges) 365 days a year. Urgent trips include sick visits, hospital discharge requests and life-sustaining treatment.  Call the [facility] Line at [phone], at any time, 24 hours a day, 7 days a week. If you are in danger or need immediate medical attention call 911.   Please see education materials related to topics discussed provided by MyChart link.  Patient verbalizes understanding of instructions and care plan provided today and agrees to view in MyChart. Active MyChart status and patient understanding of how to access instructions and care plan via MyChart confirmed with patient.     Licensed Clinical Social Worker will call you on 11/24 at 11 AM  Rolin Kerns, LCSW   [facility], [facility] Clinical Social Worker Direct Dial: [phone]  Fax: [phone] Website: [email or web address] 2:12 PM   Following is a copy of your plan of care:  There are no care plans that you recently modified to display for this patient.

## 2024-03-14 ENCOUNTER — Telehealth: Payer: Self-pay | Admitting: Licensed Clinical Social Worker

## 2024-03-14 ENCOUNTER — Encounter: Payer: Self-pay | Admitting: Licensed Clinical Social Worker

## 2024-03-16 ENCOUNTER — Ambulatory Visit (HOSPITAL_COMMUNITY): Payer: Self-pay

## 2024-03-18 NOTE — Patient Instructions (Signed)
 Geni Kurk - I am sorry I was unable to reach you today for our scheduled appointment. I work with Celestia Rosaline SQUIBB, NP and am calling to support your healthcare needs. Please contact me at [phone] at your earliest convenience. I look forward to speaking with you soon.   Thank you,  Rolin Kerns, LCSW [employer]  [facility], [facility] Clinical Social Worker Direct Dial: [phone]  Fax: [phone] Website: [email or web address] 4:52 PM

## 2024-03-24 DIAGNOSIS — Z113 Encounter for screening for infections with a predominantly sexual mode of transmission: Secondary | ICD-10-CM | POA: Diagnosis not present

## 2024-04-18 ENCOUNTER — Encounter: Payer: Self-pay | Admitting: Licensed Clinical Social Worker

## 2024-04-18 ENCOUNTER — Telehealth: Payer: Self-pay | Admitting: Licensed Clinical Social Worker

## 2024-04-19 NOTE — Patient Instructions (Signed)
 Kristine Oconnell - I am sorry I was unable to reach you today for our scheduled appointment. I work with Celestia Rosaline SQUIBB, NP and am calling to support your healthcare needs. Please contact me at [phone] at your earliest convenience. I look forward to speaking with you soon.   Thank you,  Rolin Kerns, LCSW [place]  [facility], [facility] Clinical Social Worker Direct Dial: [phone]  Fax: [phone] Website: [email or web address] 9:38 AM

## 2024-05-05 ENCOUNTER — Encounter (INDEPENDENT_AMBULATORY_CARE_PROVIDER_SITE_OTHER): Payer: Self-pay | Admitting: Primary Care

## 2024-05-05 ENCOUNTER — Telehealth (INDEPENDENT_AMBULATORY_CARE_PROVIDER_SITE_OTHER): Payer: Self-pay | Admitting: Primary Care

## 2024-05-05 DIAGNOSIS — M542 Cervicalgia: Secondary | ICD-10-CM

## 2024-05-05 DIAGNOSIS — M25561 Pain in right knee: Secondary | ICD-10-CM

## 2024-05-05 DIAGNOSIS — M25512 Pain in left shoulder: Secondary | ICD-10-CM | POA: Diagnosis not present

## 2024-05-05 DIAGNOSIS — M545 Low back pain, unspecified: Secondary | ICD-10-CM | POA: Diagnosis not present
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
## 2024-05-13 ENCOUNTER — Ambulatory Visit

## 2024-05-17 ENCOUNTER — Ambulatory Visit

## 2024-05-18 ENCOUNTER — Telehealth: Payer: Self-pay | Admitting: Licensed Clinical Social Worker

## 2024-05-26 ENCOUNTER — Ambulatory Visit

## 2024-05-26 ENCOUNTER — Other Ambulatory Visit: Payer: Self-pay

## 2024-05-26 DIAGNOSIS — M542 Cervicalgia: Secondary | ICD-10-CM

## 2024-05-26 DIAGNOSIS — M25561 Pain in right knee: Secondary | ICD-10-CM

## 2024-05-26 DIAGNOSIS — M5459 Other low back pain: Secondary | ICD-10-CM

## 2024-05-26 DIAGNOSIS — M25512 Pain in left shoulder: Secondary | ICD-10-CM
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
## 2024-05-31 ENCOUNTER — Ambulatory Visit: Admitting: Physical Therapy

## 2024-06-06 ENCOUNTER — Ambulatory Visit

## 2024-06-09 ENCOUNTER — Ambulatory Visit

## 2024-06-14 ENCOUNTER — Ambulatory Visit

## 2024-06-16 ENCOUNTER — Ambulatory Visit

## 2024-06-21 ENCOUNTER — Ambulatory Visit

## 2024-06-23 ENCOUNTER — Ambulatory Visit
